# Patient Record
Sex: Male | Born: 1945 | Race: White | Hispanic: No | Marital: Married | Smoking: Former smoker
Health system: Southern US, Community
[De-identification: ages and names within clinical notes are randomized; demographics above are authoritative.]

## PROBLEM LIST (undated history)

## (undated) DIAGNOSIS — N200 Calculus of kidney: Secondary | ICD-10-CM

## (undated) DIAGNOSIS — K279 Peptic ulcer, site unspecified, unspecified as acute or chronic, without hemorrhage or perforation: Secondary | ICD-10-CM

## (undated) DIAGNOSIS — E119 Type 2 diabetes mellitus without complications: Secondary | ICD-10-CM

## (undated) DIAGNOSIS — E7800 Pure hypercholesterolemia, unspecified: Secondary | ICD-10-CM

## (undated) DIAGNOSIS — I10 Essential (primary) hypertension: Secondary | ICD-10-CM

## (undated) HISTORY — DX: Calculus of kidney: N20.0

## (undated) HISTORY — DX: Type 2 diabetes mellitus without complications: E11.9

## (undated) HISTORY — DX: Essential (primary) hypertension: I10

## (undated) HISTORY — DX: Peptic ulcer, site unspecified, unspecified as acute or chronic, without hemorrhage or perforation: K27.9

## (undated) HISTORY — PX: KNEE ARTHROSCOPY: SUR90

## (undated) HISTORY — PX: THYROIDECTOMY: SHX17

## (undated) HISTORY — PX: SPINE SURGERY: SHX786

## (undated) HISTORY — DX: Pure hypercholesterolemia, unspecified: E78.00

## (undated) HISTORY — PX: NECK SURGERY: SHX720

## (undated) HISTORY — PX: BACK SURGERY: SHX140

## (undated) HISTORY — PX: BRAIN SURGERY: SHX531

## (undated) HISTORY — PX: CHOLECYSTECTOMY: SHX55

---

## 1998-02-05 ENCOUNTER — Ambulatory Visit (HOSPITAL_COMMUNITY): Admission: RE | Admit: 1998-02-05 | Discharge: 1998-02-05 | Payer: Self-pay | Admitting: Neurosurgery

## 1998-02-19 ENCOUNTER — Ambulatory Visit (HOSPITAL_COMMUNITY): Admission: RE | Admit: 1998-02-19 | Discharge: 1998-02-19 | Payer: Self-pay | Admitting: Neurosurgery

## 2014-10-22 DIAGNOSIS — Z125 Encounter for screening for malignant neoplasm of prostate: Secondary | ICD-10-CM | POA: Diagnosis not present

## 2014-10-22 DIAGNOSIS — E038 Other specified hypothyroidism: Secondary | ICD-10-CM | POA: Diagnosis not present

## 2014-10-22 DIAGNOSIS — I10 Essential (primary) hypertension: Secondary | ICD-10-CM | POA: Diagnosis not present

## 2014-10-22 DIAGNOSIS — E785 Hyperlipidemia, unspecified: Secondary | ICD-10-CM | POA: Diagnosis not present

## 2014-10-22 DIAGNOSIS — M545 Low back pain: Secondary | ICD-10-CM | POA: Diagnosis not present

## 2014-10-22 DIAGNOSIS — E119 Type 2 diabetes mellitus without complications: Secondary | ICD-10-CM | POA: Diagnosis not present

## 2015-01-15 DIAGNOSIS — M25562 Pain in left knee: Secondary | ICD-10-CM | POA: Diagnosis not present

## 2015-01-16 DIAGNOSIS — M25562 Pain in left knee: Secondary | ICD-10-CM | POA: Diagnosis not present

## 2015-01-16 DIAGNOSIS — M23322 Other meniscus derangements, posterior horn of medial meniscus, left knee: Secondary | ICD-10-CM | POA: Diagnosis not present

## 2015-01-16 DIAGNOSIS — M23222 Derangement of posterior horn of medial meniscus due to old tear or injury, left knee: Secondary | ICD-10-CM | POA: Diagnosis not present

## 2015-01-17 DIAGNOSIS — S83242A Other tear of medial meniscus, current injury, left knee, initial encounter: Secondary | ICD-10-CM | POA: Diagnosis not present

## 2015-01-21 DIAGNOSIS — E785 Hyperlipidemia, unspecified: Secondary | ICD-10-CM | POA: Diagnosis not present

## 2015-01-21 DIAGNOSIS — M545 Low back pain: Secondary | ICD-10-CM | POA: Diagnosis not present

## 2015-01-21 DIAGNOSIS — E038 Other specified hypothyroidism: Secondary | ICD-10-CM | POA: Diagnosis not present

## 2015-01-21 DIAGNOSIS — E119 Type 2 diabetes mellitus without complications: Secondary | ICD-10-CM | POA: Diagnosis not present

## 2015-01-21 DIAGNOSIS — I10 Essential (primary) hypertension: Secondary | ICD-10-CM | POA: Diagnosis not present

## 2015-01-21 DIAGNOSIS — Z01818 Encounter for other preprocedural examination: Secondary | ICD-10-CM | POA: Diagnosis not present

## 2015-02-08 DIAGNOSIS — M25562 Pain in left knee: Secondary | ICD-10-CM | POA: Diagnosis not present

## 2015-02-08 DIAGNOSIS — M19072 Primary osteoarthritis, left ankle and foot: Secondary | ICD-10-CM | POA: Diagnosis not present

## 2015-02-08 DIAGNOSIS — S83242A Other tear of medial meniscus, current injury, left knee, initial encounter: Secondary | ICD-10-CM | POA: Diagnosis not present

## 2015-02-08 DIAGNOSIS — I10 Essential (primary) hypertension: Secondary | ICD-10-CM | POA: Diagnosis not present

## 2015-02-08 DIAGNOSIS — E119 Type 2 diabetes mellitus without complications: Secondary | ICD-10-CM | POA: Diagnosis not present

## 2015-02-08 DIAGNOSIS — M659 Synovitis and tenosynovitis, unspecified: Secondary | ICD-10-CM | POA: Diagnosis not present

## 2015-02-08 DIAGNOSIS — M23204 Derangement of unspecified medial meniscus due to old tear or injury, left knee: Secondary | ICD-10-CM | POA: Diagnosis not present

## 2015-02-08 DIAGNOSIS — Z87891 Personal history of nicotine dependence: Secondary | ICD-10-CM | POA: Diagnosis not present

## 2015-02-08 DIAGNOSIS — E039 Hypothyroidism, unspecified: Secondary | ICD-10-CM | POA: Diagnosis not present

## 2015-02-11 DIAGNOSIS — M25562 Pain in left knee: Secondary | ICD-10-CM | POA: Diagnosis not present

## 2015-02-11 DIAGNOSIS — M6281 Muscle weakness (generalized): Secondary | ICD-10-CM | POA: Diagnosis not present

## 2015-02-11 DIAGNOSIS — R2689 Other abnormalities of gait and mobility: Secondary | ICD-10-CM | POA: Diagnosis not present

## 2015-02-11 DIAGNOSIS — S83242D Other tear of medial meniscus, current injury, left knee, subsequent encounter: Secondary | ICD-10-CM | POA: Diagnosis not present

## 2015-02-13 DIAGNOSIS — S83242D Other tear of medial meniscus, current injury, left knee, subsequent encounter: Secondary | ICD-10-CM | POA: Diagnosis not present

## 2015-02-13 DIAGNOSIS — M6281 Muscle weakness (generalized): Secondary | ICD-10-CM | POA: Diagnosis not present

## 2015-02-13 DIAGNOSIS — R2689 Other abnormalities of gait and mobility: Secondary | ICD-10-CM | POA: Diagnosis not present

## 2015-02-13 DIAGNOSIS — M25562 Pain in left knee: Secondary | ICD-10-CM | POA: Diagnosis not present

## 2015-04-22 DIAGNOSIS — M545 Low back pain: Secondary | ICD-10-CM | POA: Diagnosis not present

## 2015-04-22 DIAGNOSIS — E785 Hyperlipidemia, unspecified: Secondary | ICD-10-CM | POA: Diagnosis not present

## 2015-04-22 DIAGNOSIS — E119 Type 2 diabetes mellitus without complications: Secondary | ICD-10-CM | POA: Diagnosis not present

## 2015-04-22 DIAGNOSIS — I10 Essential (primary) hypertension: Secondary | ICD-10-CM | POA: Diagnosis not present

## 2015-04-22 DIAGNOSIS — Z9181 History of falling: Secondary | ICD-10-CM | POA: Diagnosis not present

## 2015-08-01 DIAGNOSIS — R197 Diarrhea, unspecified: Secondary | ICD-10-CM | POA: Diagnosis not present

## 2015-08-21 DIAGNOSIS — E119 Type 2 diabetes mellitus without complications: Secondary | ICD-10-CM | POA: Diagnosis not present

## 2015-08-21 DIAGNOSIS — S61409A Unspecified open wound of unspecified hand, initial encounter: Secondary | ICD-10-CM | POA: Diagnosis not present

## 2015-08-21 DIAGNOSIS — E785 Hyperlipidemia, unspecified: Secondary | ICD-10-CM | POA: Diagnosis not present

## 2015-08-21 DIAGNOSIS — Z23 Encounter for immunization: Secondary | ICD-10-CM | POA: Diagnosis not present

## 2015-08-21 DIAGNOSIS — M545 Low back pain: Secondary | ICD-10-CM | POA: Diagnosis not present

## 2015-08-21 DIAGNOSIS — E038 Other specified hypothyroidism: Secondary | ICD-10-CM | POA: Diagnosis not present

## 2015-11-21 DIAGNOSIS — E038 Other specified hypothyroidism: Secondary | ICD-10-CM | POA: Diagnosis not present

## 2015-11-21 DIAGNOSIS — E119 Type 2 diabetes mellitus without complications: Secondary | ICD-10-CM | POA: Diagnosis not present

## 2015-11-21 DIAGNOSIS — I10 Essential (primary) hypertension: Secondary | ICD-10-CM | POA: Diagnosis not present

## 2015-11-21 DIAGNOSIS — Z125 Encounter for screening for malignant neoplasm of prostate: Secondary | ICD-10-CM | POA: Diagnosis not present

## 2015-11-21 DIAGNOSIS — K219 Gastro-esophageal reflux disease without esophagitis: Secondary | ICD-10-CM | POA: Diagnosis not present

## 2015-11-21 DIAGNOSIS — M545 Low back pain: Secondary | ICD-10-CM | POA: Diagnosis not present

## 2016-01-13 DIAGNOSIS — N3090 Cystitis, unspecified without hematuria: Secondary | ICD-10-CM | POA: Diagnosis not present

## 2016-01-13 DIAGNOSIS — N401 Enlarged prostate with lower urinary tract symptoms: Secondary | ICD-10-CM | POA: Diagnosis not present

## 2016-01-14 DIAGNOSIS — R6889 Other general symptoms and signs: Secondary | ICD-10-CM | POA: Diagnosis not present

## 2016-01-14 DIAGNOSIS — R509 Fever, unspecified: Secondary | ICD-10-CM | POA: Diagnosis not present

## 2016-01-14 DIAGNOSIS — N390 Urinary tract infection, site not specified: Secondary | ICD-10-CM | POA: Diagnosis not present

## 2016-02-19 DIAGNOSIS — I10 Essential (primary) hypertension: Secondary | ICD-10-CM | POA: Diagnosis not present

## 2016-02-19 DIAGNOSIS — E785 Hyperlipidemia, unspecified: Secondary | ICD-10-CM | POA: Diagnosis not present

## 2016-02-19 DIAGNOSIS — E038 Other specified hypothyroidism: Secondary | ICD-10-CM | POA: Diagnosis not present

## 2016-02-19 DIAGNOSIS — E119 Type 2 diabetes mellitus without complications: Secondary | ICD-10-CM | POA: Diagnosis not present

## 2016-02-19 DIAGNOSIS — M545 Low back pain: Secondary | ICD-10-CM | POA: Diagnosis not present

## 2016-05-21 DIAGNOSIS — E785 Hyperlipidemia, unspecified: Secondary | ICD-10-CM | POA: Diagnosis not present

## 2016-05-21 DIAGNOSIS — M545 Low back pain: Secondary | ICD-10-CM | POA: Diagnosis not present

## 2016-05-21 DIAGNOSIS — Z9181 History of falling: Secondary | ICD-10-CM | POA: Diagnosis not present

## 2016-05-21 DIAGNOSIS — E119 Type 2 diabetes mellitus without complications: Secondary | ICD-10-CM | POA: Diagnosis not present

## 2016-07-27 DIAGNOSIS — I10 Essential (primary) hypertension: Secondary | ICD-10-CM | POA: Diagnosis not present

## 2016-07-27 DIAGNOSIS — Z981 Arthrodesis status: Secondary | ICD-10-CM | POA: Diagnosis not present

## 2016-08-05 DIAGNOSIS — M48061 Spinal stenosis, lumbar region without neurogenic claudication: Secondary | ICD-10-CM | POA: Diagnosis not present

## 2016-08-05 DIAGNOSIS — Z981 Arthrodesis status: Secondary | ICD-10-CM | POA: Diagnosis not present

## 2016-08-05 DIAGNOSIS — M4807 Spinal stenosis, lumbosacral region: Secondary | ICD-10-CM | POA: Diagnosis not present

## 2016-08-12 DIAGNOSIS — M47816 Spondylosis without myelopathy or radiculopathy, lumbar region: Secondary | ICD-10-CM | POA: Diagnosis not present

## 2016-08-12 DIAGNOSIS — M47813 Spondylosis without myelopathy or radiculopathy, cervicothoracic region: Secondary | ICD-10-CM | POA: Diagnosis not present

## 2016-08-18 ENCOUNTER — Other Ambulatory Visit: Payer: Self-pay | Admitting: Neurosurgery

## 2016-08-18 DIAGNOSIS — M47813 Spondylosis without myelopathy or radiculopathy, cervicothoracic region: Secondary | ICD-10-CM

## 2016-08-19 ENCOUNTER — Ambulatory Visit
Admission: RE | Admit: 2016-08-19 | Discharge: 2016-08-19 | Disposition: A | Discharge status: Home / Self Care | Payer: Medicare Other | Source: Ambulatory Visit | Attending: Neurosurgery | Admitting: Neurosurgery

## 2016-08-19 DIAGNOSIS — M47813 Spondylosis without myelopathy or radiculopathy, cervicothoracic region: Secondary | ICD-10-CM

## 2016-08-19 DIAGNOSIS — M5124 Other intervertebral disc displacement, thoracic region: Secondary | ICD-10-CM | POA: Diagnosis not present

## 2016-08-26 DIAGNOSIS — E119 Type 2 diabetes mellitus without complications: Secondary | ICD-10-CM | POA: Diagnosis not present

## 2016-08-26 DIAGNOSIS — Z23 Encounter for immunization: Secondary | ICD-10-CM | POA: Diagnosis not present

## 2016-08-26 DIAGNOSIS — E785 Hyperlipidemia, unspecified: Secondary | ICD-10-CM | POA: Diagnosis not present

## 2016-08-26 DIAGNOSIS — Z1389 Encounter for screening for other disorder: Secondary | ICD-10-CM | POA: Diagnosis not present

## 2016-08-26 DIAGNOSIS — I10 Essential (primary) hypertension: Secondary | ICD-10-CM | POA: Diagnosis not present

## 2016-11-26 DIAGNOSIS — M545 Low back pain: Secondary | ICD-10-CM | POA: Diagnosis not present

## 2016-11-26 DIAGNOSIS — E785 Hyperlipidemia, unspecified: Secondary | ICD-10-CM | POA: Diagnosis not present

## 2016-11-26 DIAGNOSIS — Z1389 Encounter for screening for other disorder: Secondary | ICD-10-CM | POA: Diagnosis not present

## 2016-11-26 DIAGNOSIS — E119 Type 2 diabetes mellitus without complications: Secondary | ICD-10-CM | POA: Diagnosis not present

## 2017-02-26 DIAGNOSIS — E785 Hyperlipidemia, unspecified: Secondary | ICD-10-CM | POA: Diagnosis not present

## 2017-02-26 DIAGNOSIS — M545 Low back pain: Secondary | ICD-10-CM | POA: Diagnosis not present

## 2017-02-26 DIAGNOSIS — E119 Type 2 diabetes mellitus without complications: Secondary | ICD-10-CM | POA: Diagnosis not present

## 2017-02-26 DIAGNOSIS — I10 Essential (primary) hypertension: Secondary | ICD-10-CM | POA: Diagnosis not present

## 2017-02-26 DIAGNOSIS — K219 Gastro-esophageal reflux disease without esophagitis: Secondary | ICD-10-CM | POA: Diagnosis not present

## 2017-06-02 DIAGNOSIS — Z1389 Encounter for screening for other disorder: Secondary | ICD-10-CM | POA: Diagnosis not present

## 2017-06-02 DIAGNOSIS — E063 Autoimmune thyroiditis: Secondary | ICD-10-CM | POA: Diagnosis not present

## 2017-06-02 DIAGNOSIS — E119 Type 2 diabetes mellitus without complications: Secondary | ICD-10-CM | POA: Diagnosis not present

## 2017-06-02 DIAGNOSIS — Z9181 History of falling: Secondary | ICD-10-CM | POA: Diagnosis not present

## 2017-06-02 DIAGNOSIS — M545 Low back pain: Secondary | ICD-10-CM | POA: Diagnosis not present

## 2017-06-02 DIAGNOSIS — E785 Hyperlipidemia, unspecified: Secondary | ICD-10-CM | POA: Diagnosis not present

## 2017-08-04 DIAGNOSIS — Z23 Encounter for immunization: Secondary | ICD-10-CM | POA: Diagnosis not present

## 2017-09-02 DIAGNOSIS — E785 Hyperlipidemia, unspecified: Secondary | ICD-10-CM | POA: Diagnosis not present

## 2017-09-02 DIAGNOSIS — M545 Low back pain: Secondary | ICD-10-CM | POA: Diagnosis not present

## 2017-09-02 DIAGNOSIS — E063 Autoimmune thyroiditis: Secondary | ICD-10-CM | POA: Diagnosis not present

## 2017-09-02 DIAGNOSIS — E119 Type 2 diabetes mellitus without complications: Secondary | ICD-10-CM | POA: Diagnosis not present

## 2017-09-07 DIAGNOSIS — L578 Other skin changes due to chronic exposure to nonionizing radiation: Secondary | ICD-10-CM | POA: Diagnosis not present

## 2017-09-07 DIAGNOSIS — L814 Other melanin hyperpigmentation: Secondary | ICD-10-CM | POA: Diagnosis not present

## 2017-09-07 DIAGNOSIS — L820 Inflamed seborrheic keratosis: Secondary | ICD-10-CM | POA: Diagnosis not present

## 2017-09-07 DIAGNOSIS — C44319 Basal cell carcinoma of skin of other parts of face: Secondary | ICD-10-CM | POA: Diagnosis not present

## 2017-10-16 DIAGNOSIS — C44329 Squamous cell carcinoma of skin of other parts of face: Secondary | ICD-10-CM | POA: Diagnosis not present

## 2017-12-02 DIAGNOSIS — I10 Essential (primary) hypertension: Secondary | ICD-10-CM | POA: Diagnosis not present

## 2017-12-02 DIAGNOSIS — Z125 Encounter for screening for malignant neoplasm of prostate: Secondary | ICD-10-CM | POA: Diagnosis not present

## 2017-12-02 DIAGNOSIS — E785 Hyperlipidemia, unspecified: Secondary | ICD-10-CM | POA: Diagnosis not present

## 2017-12-02 DIAGNOSIS — M545 Low back pain: Secondary | ICD-10-CM | POA: Diagnosis not present

## 2017-12-02 DIAGNOSIS — E119 Type 2 diabetes mellitus without complications: Secondary | ICD-10-CM | POA: Diagnosis not present

## 2017-12-02 DIAGNOSIS — E063 Autoimmune thyroiditis: Secondary | ICD-10-CM | POA: Diagnosis not present

## 2018-02-28 DIAGNOSIS — J301 Allergic rhinitis due to pollen: Secondary | ICD-10-CM | POA: Diagnosis not present

## 2018-02-28 DIAGNOSIS — M545 Low back pain: Secondary | ICD-10-CM | POA: Diagnosis not present

## 2018-02-28 DIAGNOSIS — E785 Hyperlipidemia, unspecified: Secondary | ICD-10-CM | POA: Diagnosis not present

## 2018-02-28 DIAGNOSIS — E063 Autoimmune thyroiditis: Secondary | ICD-10-CM | POA: Diagnosis not present

## 2018-02-28 DIAGNOSIS — I10 Essential (primary) hypertension: Secondary | ICD-10-CM | POA: Diagnosis not present

## 2018-05-25 DIAGNOSIS — M5416 Radiculopathy, lumbar region: Secondary | ICD-10-CM | POA: Diagnosis not present

## 2018-06-01 DIAGNOSIS — E785 Hyperlipidemia, unspecified: Secondary | ICD-10-CM | POA: Diagnosis not present

## 2018-06-01 DIAGNOSIS — E118 Type 2 diabetes mellitus with unspecified complications: Secondary | ICD-10-CM | POA: Diagnosis not present

## 2018-06-01 DIAGNOSIS — I10 Essential (primary) hypertension: Secondary | ICD-10-CM | POA: Diagnosis not present

## 2018-06-01 DIAGNOSIS — M545 Low back pain: Secondary | ICD-10-CM | POA: Diagnosis not present

## 2018-06-01 DIAGNOSIS — E063 Autoimmune thyroiditis: Secondary | ICD-10-CM | POA: Diagnosis not present

## 2018-06-01 DIAGNOSIS — E119 Type 2 diabetes mellitus without complications: Secondary | ICD-10-CM | POA: Diagnosis not present

## 2018-06-06 ENCOUNTER — Encounter: Payer: Self-pay | Admitting: Physical Medicine & Rehabilitation

## 2018-06-14 ENCOUNTER — Encounter: Payer: Medicare Other | Attending: Physical Medicine & Rehabilitation | Admitting: Physical Medicine & Rehabilitation

## 2018-06-14 ENCOUNTER — Encounter: Payer: Self-pay | Admitting: Physical Medicine & Rehabilitation

## 2018-06-14 VITALS — BP 147/96 | HR 81 | Ht 67.0 in | Wt 201.4 lb

## 2018-06-14 DIAGNOSIS — E7800 Pure hypercholesterolemia, unspecified: Secondary | ICD-10-CM | POA: Diagnosis not present

## 2018-06-14 DIAGNOSIS — M5415 Radiculopathy, thoracolumbar region: Secondary | ICD-10-CM | POA: Diagnosis not present

## 2018-06-14 DIAGNOSIS — G8929 Other chronic pain: Secondary | ICD-10-CM | POA: Diagnosis not present

## 2018-06-14 DIAGNOSIS — E119 Type 2 diabetes mellitus without complications: Secondary | ICD-10-CM | POA: Diagnosis not present

## 2018-06-14 DIAGNOSIS — I10 Essential (primary) hypertension: Secondary | ICD-10-CM | POA: Diagnosis not present

## 2018-06-14 DIAGNOSIS — M961 Postlaminectomy syndrome, not elsewhere classified: Secondary | ICD-10-CM | POA: Insufficient documentation

## 2018-06-14 DIAGNOSIS — Z87442 Personal history of urinary calculi: Secondary | ICD-10-CM | POA: Diagnosis not present

## 2018-06-14 DIAGNOSIS — Z87891 Personal history of nicotine dependence: Secondary | ICD-10-CM | POA: Insufficient documentation

## 2018-06-14 DIAGNOSIS — M48061 Spinal stenosis, lumbar region without neurogenic claudication: Secondary | ICD-10-CM | POA: Insufficient documentation

## 2018-06-14 DIAGNOSIS — Z981 Arthrodesis status: Secondary | ICD-10-CM | POA: Insufficient documentation

## 2018-06-14 DIAGNOSIS — M545 Low back pain: Secondary | ICD-10-CM | POA: Insufficient documentation

## 2018-06-14 MED ORDER — METHOCARBAMOL 500 MG PO TABS: 500.0000 mg | ORAL_TABLET | Freq: Three times a day (TID) | ORAL | 2 refills | Status: DC | PRN

## 2018-06-14 NOTE — Progress Notes (Signed)
Subjective:    Patient ID: Sean [name], male    DOB: 22-Jan-1946, 72 y.o.   MRN: [id number]  HPI   This is an initial office visit for Sean [name] who is a 72 year old male with diabetes and hypertension with a 20 year history of low back pain who is status post numerous surgeries beginning int he mid 1980's including an L4-L5 fusion in 2006 as well as a right L2-L3 TLIF in 2009 and T12/S1-iliac wing fusion in 2010. He has worsening pain over this time which has affected his quality of back. He has had increasing mid back pain as well as numbness and tingling in both legs. He finds that lying supine is the most painful position and sleep is very poor as a result. For pain he's taking hydrocodone 4x daily which provides some relief. He hasn't seen a Psychologist, sport and exercise, and his primary has been following his pain.  MRI of the thoracic spine dated August 19, 2016 demonstrates his T12-L1 fusion and moderate thoracic facet arthropathy with mild foraminal stenosis at T9 on the left and mild to moderate  T10.  A CT with contrast of the lumbar spine dated 08/05/2016 notes mild bilateral foraminal stenosis encroaching upon the L5 nerve roots bilaterally, at L4-L5 there is similar findings.  There are also similar findings at L2-L3/L3-L4.  Canal is reported as decompressed posteriorly without central stenosis.  Ice does provide some relief as well as hot baths can help too.  He also has a TENS unit but is not using it regularly.  He remains active but doesn't do any focused stretching of his low back. He likes to use his stationary bike.   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  He notes that when he is more active out on his farm his back pain is worse as a result.  Pain Inventory Average Pain 9 Pain Right Now 9 My pain is constant and  sharp  In the last 24 hours, has pain interfered with the following? General activity 9 Relation with others 9 Enjoyment of life 10 What TIME of day is your pain at its worst? night Sleep (in general) Poor  Pain is worse with: inactivity Pain improves with: heat/ice, medication and TENS Relief from Meds: 2  Mobility walk without assistance ability to climb steps?  yes do you drive?  yes  Function retired  Neuro/Psych bowel control problems tingling spasms  Prior Studies new patient  Physicians involved in your care new patient   No family history on file. Social History   Socioeconomic History  . Marital status: Married    Spouse name: Not on file  . Number of children: Not on file  . Years of education: Not on file  . Highest education level: Not on file  Occupational History  . Not on file  Social Needs  . Financial resource strain: Not on file  . Food insecurity:    Worry: Not on file    Inability: Not on file  . Transportation needs:    Medical: Not on file    Non-medical: Not on file  Tobacco Use  . Smoking status: Former Smoker    Types: Cigarettes  . Smokeless tobacco: Never Used  Substance and Sexual Activity  . Alcohol use: Not Currently  .  Drug use: Never  . Sexual activity: Not on file  Lifestyle  . Physical activity:    Days per week: Not on file    Minutes per session: Not on file  . Stress: Not on file  Relationships  . Social connections:    Talks on phone: Not on file    Gets together: Not on file    Attends religious service: Not on file    Active member of club or organization: Not on file    Attends meetings of clubs or organizations: Not on file    Relationship status: Not on file  Other Topics Concern  . Not on file  Social History Narrative  . Not on file   Past Surgical History:  Procedure Laterality Date  . BACK SURGERY    . BRAIN SURGERY    . CHOLECYSTECTOMY    . KNEE ARTHROSCOPY    . NECK SURGERY    .  THYROIDECTOMY     Past Medical History:  Diagnosis Date  . Diabetes ([place])   . Hypercholesteremia   . Hypertension   . Kidney stone   . Peptic ulcer disease    Ht 5\' 7"  (1.702 m)   Wt 201 lb 6.4 oz (91.4 kg)   BMI 31.54 kg/m   Opioid Risk Score:   Fall Risk Score:  `1  Depression screen PHQ 2/9  No flowsheet data found.   Review of Systems  Constitutional: Negative.   HENT: Negative.   Eyes: Negative.   Respiratory: Negative.   Cardiovascular: Negative.   Gastrointestinal: Positive for diarrhea.  Endocrine: Negative.   Genitourinary: Negative.   Musculoskeletal: Positive for arthralgias, back pain and myalgias.  Skin: Negative.   Allergic/Immunologic: Negative.   Neurological: Positive for numbness.  Hematological: Negative.   Psychiatric/Behavioral: Negative.   All other systems reviewed and are negative.      Objective:   Physical Exam  General: Alert and oriented x 3, No apparent distress HEENT: Head is normocephalic, atraumatic, PERRLA, EOMI, sclera anicteric, oral mucosa pink and moist, dentition intact, ext ear canals clear,  Neck: Supple without JVD or lymphadenopathy Heart: Reg rate and rhythm. No murmurs rubs or gallops Chest: CTA bilaterally without wheezes, rales, or rhonchi; no distress Abdomen: Soft, non-tender, non-distended, bowel sounds positive. Extremities: No clubbing, cyanosis, or edema. Pulses are 2+ Skin: Clean and intact without signs of breakdown Neuro: Pt is cognitively appropriate with normal insight, memory, and awareness. Cranial nerves 2-12 are intact. Sensory exam is normal. Reflexes are 2+ in upper extremities and trace to absent in the lower extremities fine motor coordination is intact. No tremors. Motor function is grossly 5/5 in all 4 limbs.  Musculoskeletal: He has mild levoscoliosis of the thoracolumbar spine.  He is able to bend at the waist to about 60 to 70 degrees but mostly this is at his hips.  There is very minimal  lumbar flexibility.  He is able to lateral bend and rotate to about 20 degrees and extend to about 20 degrees as well.  He is tender with palpation at the T12/T11 area with notable muscle spasm seen.  PSIS areas and greater trochanters are minimally tender.  He ambulates with a slightly wide-based gait and without antalgia. Psych: Pt's affect is appropriate. Pt is cooperative        Assessment & Plan:  1. Chronic low back pain 2. Lumbar post-laminectomy syndrome with at least 6 lumbar surgeries by my count.  He has remained quite active despite his limitations  and pain.  I am afraid that he may be doing too much at times, however.  He has some mechanical pain proximal to his fusion site.  May be a muscular component to this.  Most recent MRI did not reveal any substantial thoracic abnormalities although degeneration at T11/T12 would be a concern.   Plan: 1.  Made referral to [facility] in [place] to address lumbar strengthening and range of motion as well as ergonomics and posture.  He needs to come up with an individualized home exercise program to improve his range of motion and mechanics. 2.  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  He wants to keep up doing the things he has done for years but it appears to be exacerbating his pain at this point.  Hopefully therapy will help him in this area at least from an ergonomic standpoint. 3.  Trial of Robaxin 500 mg every 8 hours as needed for muscle spasms 4.  Continue hydrocodone 5/325 per primary physician every 6 hours as needed 5.  Consider new imaging of his thoracolumbar spine given that the old imaging is 72 years old at this point. 6.  Consider referral to neurosurgery for spinal stimulator assessment.  Both patient and daughter do not want to increase or add further narcotic medications.  45  minutes was spent today with the patient with more than 50% of this time spent in direct patient consultation in chart/imaging review.  I will see him back in about 1 month's time.

## 2018-06-14 NOTE — Patient Instructions (Addendum)
BE AGGRESSIVE WITH YOUR HEAT, TENS AND TOPICAL TREATMENTS.    YOU NEED TO BE REALISTIC ABOUT YOUR ACTIVITIES

## 2018-07-01 DIAGNOSIS — M5415 Radiculopathy, thoracolumbar region: Secondary | ICD-10-CM | POA: Diagnosis not present

## 2018-07-01 DIAGNOSIS — M545 Low back pain: Secondary | ICD-10-CM | POA: Diagnosis not present

## 2018-07-01 DIAGNOSIS — M961 Postlaminectomy syndrome, not elsewhere classified: Secondary | ICD-10-CM | POA: Diagnosis not present

## 2018-07-11 ENCOUNTER — Encounter: Payer: Medicare Other | Admitting: Physical Medicine & Rehabilitation

## 2018-07-15 DIAGNOSIS — M545 Low back pain: Secondary | ICD-10-CM | POA: Diagnosis not present

## 2018-07-15 DIAGNOSIS — M961 Postlaminectomy syndrome, not elsewhere classified: Secondary | ICD-10-CM | POA: Diagnosis not present

## 2018-07-15 DIAGNOSIS — M5415 Radiculopathy, thoracolumbar region: Secondary | ICD-10-CM | POA: Diagnosis not present

## 2018-07-28 DIAGNOSIS — M5415 Radiculopathy, thoracolumbar region: Secondary | ICD-10-CM | POA: Diagnosis not present

## 2018-07-28 DIAGNOSIS — M545 Low back pain: Secondary | ICD-10-CM | POA: Diagnosis not present

## 2018-07-28 DIAGNOSIS — M961 Postlaminectomy syndrome, not elsewhere classified: Secondary | ICD-10-CM | POA: Diagnosis not present

## 2018-08-03 ENCOUNTER — Encounter: Payer: Self-pay | Admitting: Physical Medicine & Rehabilitation

## 2018-08-03 ENCOUNTER — Encounter: Payer: Medicare Other | Attending: Physical Medicine & Rehabilitation | Admitting: Physical Medicine & Rehabilitation

## 2018-08-03 VITALS — BP 124/86 | HR 84 | Ht 68.0 in | Wt 204.0 lb

## 2018-08-03 DIAGNOSIS — M961 Postlaminectomy syndrome, not elsewhere classified: Secondary | ICD-10-CM | POA: Diagnosis not present

## 2018-08-03 DIAGNOSIS — E119 Type 2 diabetes mellitus without complications: Secondary | ICD-10-CM | POA: Diagnosis not present

## 2018-08-03 DIAGNOSIS — M5415 Radiculopathy, thoracolumbar region: Secondary | ICD-10-CM

## 2018-08-03 DIAGNOSIS — E7800 Pure hypercholesterolemia, unspecified: Secondary | ICD-10-CM | POA: Diagnosis not present

## 2018-08-03 DIAGNOSIS — M48061 Spinal stenosis, lumbar region without neurogenic claudication: Secondary | ICD-10-CM | POA: Diagnosis not present

## 2018-08-03 DIAGNOSIS — M545 Low back pain: Secondary | ICD-10-CM | POA: Insufficient documentation

## 2018-08-03 DIAGNOSIS — Z87442 Personal history of urinary calculi: Secondary | ICD-10-CM | POA: Insufficient documentation

## 2018-08-03 DIAGNOSIS — I10 Essential (primary) hypertension: Secondary | ICD-10-CM | POA: Diagnosis not present

## 2018-08-03 DIAGNOSIS — Z87891 Personal history of nicotine dependence: Secondary | ICD-10-CM | POA: Diagnosis not present

## 2018-08-03 DIAGNOSIS — Z981 Arthrodesis status: Secondary | ICD-10-CM | POA: Insufficient documentation

## 2018-08-03 DIAGNOSIS — G8929 Other chronic pain: Secondary | ICD-10-CM | POA: Insufficient documentation

## 2018-08-03 MED ORDER — HYDROCODONE-ACETAMINOPHEN 7.5-325 MG PO TABS: 1.0000 | ORAL_TABLET | Freq: Four times a day (QID) | ORAL | 0 refills | Status: DC | PRN

## 2018-08-03 NOTE — Progress Notes (Signed)
Subjective:    Patient ID: Sean [name], male    DOB: [birth year], 72 y.o.   MRN: [id number]  HPI   Mr. Dethloff is here in follow-up of his chronic low back pain after multiple surgeries.  I sent [facility] to [facility] in [place] and he tells me that therapy "did him no good".  They did some massaging on him but were limited in exercise activities given his multiple fusions.  He eventually just stopped going.  I placed him on some Robaxin to help with muscle spasms and the medication made him sick on his stomach.  He still using hydrocodone per his primary 5 mg/325 every 6 hours as needed.  He uses this mostly in the evening hours and overnight.  I asked him if he made any modifications to his physical activities and he said essentially no.  If he has heavy loads or things that to perform he seems hesitant to ask help from family members or friends.  Pain Inventory Average Pain 8 Pain Right Now 8 My pain is constant, dull and tingling  In the last 24 hours, has pain interfered with the following? General activity 6 Relation with others 6 Enjoyment of life 6 What TIME of day is your pain at its worst? night Sleep (in general) Poor  Pain is worse with: walking, sitting and standing Pain improves with: medication and TENS Relief from Meds: 5  Mobility walk without assistance  Function retired  Neuro/Psych numbness  Prior Studies Any changes since last visit?  no  Physicians involved in your care Any changes since last visit?  no   No family history on file. Social History   Socioeconomic History  . Marital status: Married    Spouse name: Not on file  . Number of children: Not on file  . Years of education: Not on file  . Highest education level: Not on file  Occupational History  . Not on file  Social Needs  . Financial resource strain: Not on file  . Food insecurity:    Worry: Not on file    Inability: Not on file  . Transportation needs:    Medical:  Not on file    Non-medical: Not on file  Tobacco Use  . Smoking status: Former Smoker    Types: Cigarettes  . Smokeless tobacco: Never Used  Substance and Sexual Activity  . Alcohol use: Not Currently  . Drug use: Never  . Sexual activity: Not on file  Lifestyle  . Physical activity:    Days per week: Not on file    Minutes per session: Not on file  . Stress: Not on file  Relationships  . Social connections:    Talks on phone: Not on file    Gets together: Not on file    Attends religious service: Not on file    Active member of club or organization: Not on file    Attends meetings of clubs or organizations: Not on file    Relationship status: Not on file  Other Topics Concern  . Not on file  Social History Narrative  . Not on file   Past Surgical History:  Procedure Laterality Date  . BACK SURGERY    . BRAIN SURGERY    . CHOLECYSTECTOMY    . KNEE ARTHROSCOPY    . NECK SURGERY    . THYROIDECTOMY     Past Medical History:  Diagnosis Date  . Diabetes ([place])   . Hypercholesteremia   .  Hypertension   . Kidney stone   . Peptic ulcer disease    There were no vitals taken for this visit.  Opioid Risk Score:   Fall Risk Score:  `1  Depression screen PHQ 2/9  No flowsheet data found.   Review of Systems  Constitutional: Negative.   HENT: Negative.   Eyes: Negative.   Respiratory: Negative.   Cardiovascular: Negative.   Gastrointestinal: Negative.   Endocrine: Negative.   Genitourinary: Negative.   Musculoskeletal: Positive for arthralgias, back pain and myalgias.  Skin: Negative.   Allergic/Immunologic: Negative.   Neurological: Positive for numbness.  Hematological: Negative.   Psychiatric/Behavioral: Negative.   All other systems reviewed and are negative.      Objective:   Physical Exam General: No acute distress HEENT: EOMI, oral membranes moist Cards: reg rate  Chest: normal effort Abdomen: Soft, NT, ND Skin: dry, intact Extremities: no  edema   Neuro:  Cognitively appropriate.  Motor and sensory exam grossly 4+ to 5 out of 5 and 2 out of 2 respectively. Musculoskeletal:  Ongoing levoscoliosis of the thoracolumbar spine.  He is able to bend to about 70 degrees but mostly this remains hip flexion.  He has minimal lumbar extension and overall minimal lumbar flexibility.  Rotating cause significant pain.  He is tender along palpation of the L4-S1 region as well as higher in the T12-L1 area.  There is notable spasms.  Continue to walk with wide-based gait and antalgia favoring each leg. Psych: Pt's affect is appropriate. Pt is cooperative        Assessment & Plan:  1. Chronic low back pain 2. Lumbar post-laminectomy syndrome with at least 6 lumbar surgeries by my count.  He has remained quite active despite his limitations and pain.  I am afraid that he may be doing too much at times, however.  He has some mechanical pain proximal to his fusion site.  May be a muscular component to this.  Most recent MRI did not reveal any substantial thoracic abnormalities although degeneration at T11/T12 would be a concern.   Plan: 1.    Discussed the importance of reasonable physical activity and the fact that he needs to begin using some common sense at home.  He did seem to express an understanding of this and will work harder to make better choices as far as his physical activity at home is concerned 2.    Discussed treatment options at length today.  Reviewed the concept of a spinal stimulator.  He would like to proceed with assessment for a spinal stimulator.  Made referral to Dr. Maryjean Ka. 3.    Maintain home exercise program to tolerance. 4.  Increase hydrocodone to 7.5/325 1 every 6 hours as needed #120.  Can refill again in about 1 month's time. 5.    Will defer any further neuro imaging of his spine to Dr. Maryjean Ka and his team..   25  minutes was spent today with the patient with more than 50% of this time spent in direct patient  consultation and review and discussion of spinal stimulator as well as his mechanics and ergonomic all considerations..  I will see him back in about 2 month's time.

## 2018-08-03 NOTE — Patient Instructions (Signed)
PLEASE FEEL FREE TO CALL OUR OFFICE WITH ANY PROBLEMS OR QUESTIONS ([phone])      

## 2018-08-16 DIAGNOSIS — Z23 Encounter for immunization: Secondary | ICD-10-CM | POA: Diagnosis not present

## 2018-09-01 DIAGNOSIS — E118 Type 2 diabetes mellitus with unspecified complications: Secondary | ICD-10-CM | POA: Diagnosis not present

## 2018-09-01 DIAGNOSIS — E063 Autoimmune thyroiditis: Secondary | ICD-10-CM | POA: Diagnosis not present

## 2018-09-01 DIAGNOSIS — I10 Essential (primary) hypertension: Secondary | ICD-10-CM | POA: Diagnosis not present

## 2018-09-01 DIAGNOSIS — M545 Low back pain: Secondary | ICD-10-CM | POA: Diagnosis not present

## 2018-09-01 DIAGNOSIS — E785 Hyperlipidemia, unspecified: Secondary | ICD-10-CM | POA: Diagnosis not present

## 2018-09-13 ENCOUNTER — Telehealth: Payer: Self-pay

## 2018-09-13 DIAGNOSIS — M5415 Radiculopathy, thoracolumbar region: Secondary | ICD-10-CM

## 2018-09-13 DIAGNOSIS — M961 Postlaminectomy syndrome, not elsewhere classified: Secondary | ICD-10-CM

## 2018-09-13 MED ORDER — HYDROCODONE-ACETAMINOPHEN 7.5-325 MG PO TABS: 1.0000 | ORAL_TABLET | Freq: Four times a day (QID) | ORAL | 0 refills | Status: DC | PRN

## 2018-09-13 NOTE — Telephone Encounter (Signed)
Med refilled. Keep appt

## 2018-09-13 NOTE — Telephone Encounter (Signed)
Contacted patients daughter, informed.  The question she was wanting answered was....do you want to see them before their appointment with [facility] or after?  If Dr. Naaman Plummer wanted to see them after they would need to bump their appointment back a week

## 2018-09-13 NOTE — Telephone Encounter (Signed)
I thought I answered that: keep appt.....so yes see me as scheduled.

## 2018-09-13 NOTE — Telephone Encounter (Signed)
Patient daughter Jeannene Patella called stating patient needs a refill on Hydrocodone. Last filled 08/16/18 #120. [facility] in [place]. Next appt 10/03/2018. Pam did want to know did patient need to wait to see you after he sees [facility] because his appt with them is 10/05/2018?

## 2018-09-14 NOTE — Telephone Encounter (Signed)
Called patient and relayed information

## 2018-09-16 ENCOUNTER — Other Ambulatory Visit: Payer: Self-pay

## 2018-09-16 NOTE — Patient Outreach (Signed)
[facility]) Care Management  09/16/2018  Mykal L Burdin 09-12-1946 [id number]   Medication Adherence call to Mr. Greogry Goodwyn left a message for patient to call back patient is due on Glimeperide 2 mg and Metformin ER 500 mg. Mr. Smock Is showing past due under [place].   [facility] Management Direct Dial [phone]  Fax [phone] [email or web address]

## 2018-10-03 ENCOUNTER — Encounter: Payer: Self-pay | Admitting: Physical Medicine & Rehabilitation

## 2018-10-03 ENCOUNTER — Encounter: Payer: Medicare Other | Attending: Physical Medicine & Rehabilitation | Admitting: Physical Medicine & Rehabilitation

## 2018-10-03 VITALS — BP 120/79 | HR 85 | Ht 67.0 in | Wt 204.8 lb

## 2018-10-03 DIAGNOSIS — M48061 Spinal stenosis, lumbar region without neurogenic claudication: Secondary | ICD-10-CM | POA: Diagnosis not present

## 2018-10-03 DIAGNOSIS — M961 Postlaminectomy syndrome, not elsewhere classified: Secondary | ICD-10-CM | POA: Insufficient documentation

## 2018-10-03 DIAGNOSIS — E7800 Pure hypercholesterolemia, unspecified: Secondary | ICD-10-CM | POA: Insufficient documentation

## 2018-10-03 DIAGNOSIS — Z79899 Other long term (current) drug therapy: Secondary | ICD-10-CM

## 2018-10-03 DIAGNOSIS — E119 Type 2 diabetes mellitus without complications: Secondary | ICD-10-CM | POA: Diagnosis not present

## 2018-10-03 DIAGNOSIS — M545 Low back pain: Secondary | ICD-10-CM | POA: Insufficient documentation

## 2018-10-03 DIAGNOSIS — G8929 Other chronic pain: Secondary | ICD-10-CM | POA: Diagnosis not present

## 2018-10-03 DIAGNOSIS — Z5181 Encounter for therapeutic drug level monitoring: Secondary | ICD-10-CM

## 2018-10-03 DIAGNOSIS — M5415 Radiculopathy, thoracolumbar region: Secondary | ICD-10-CM | POA: Diagnosis not present

## 2018-10-03 DIAGNOSIS — Z87891 Personal history of nicotine dependence: Secondary | ICD-10-CM | POA: Insufficient documentation

## 2018-10-03 DIAGNOSIS — Z87442 Personal history of urinary calculi: Secondary | ICD-10-CM | POA: Insufficient documentation

## 2018-10-03 DIAGNOSIS — I10 Essential (primary) hypertension: Secondary | ICD-10-CM | POA: Diagnosis not present

## 2018-10-03 DIAGNOSIS — Z981 Arthrodesis status: Secondary | ICD-10-CM | POA: Diagnosis not present

## 2018-10-03 MED ORDER — HYDROCODONE-ACETAMINOPHEN 7.5-325 MG PO TABS: 1.0000 | ORAL_TABLET | Freq: Four times a day (QID) | ORAL | 0 refills | Status: DC | PRN

## 2018-10-03 NOTE — Progress Notes (Signed)
Subjective:    Patient ID: Sean [name], male    DOB: January 01, 1946, 73 y.o.   MRN: [id number]  HPI   Eero is here in follow up of his chronic pain. We increased his hydrocodone to 4x daily as needed which has helped him a good bit.  There are still some gaps with his pain control but they are better as a whole.  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  He has a visit scheduled this week to see Dr. Maryjean Ka regarding the spinal stimulator and he is trying to stay positive in this regard.  He reports some occasional loose stool which he says has been a problem since his gallbladder removal.  He was in to see his primary recently who gave him some Lomotil to try.  Pain Inventory Average Pain 5 Pain Right Now 5 My pain is sharp and aching  In the last 24 hours, has pain interfered with the following? General activity 4 Relation with others 4 Enjoyment of life 6 What TIME of day is your pain at its worst? night Sleep (in general) Poor  Pain is worse with: sitting Pain improves with: medication and TENS Relief from Meds: 5  Mobility walk without assistance ability to climb steps?  yes do you drive?  yes  Function retired  Neuro/Psych bowel control problems numbness spasms  Prior Studies Any changes since last visit?  no  Physicians involved in your care Any changes since last visit?  no   No family history on file. Social History   Socioeconomic History  . Marital status: Married    Spouse name: Not on file  . Number of children: Not on file  . Years of education: Not on file  . Highest education level: Not on file  Occupational History  . Not on file  Social Needs  . Financial resource strain: Not on file  . Food insecurity:    Worry: Not on file    Inability: Not on file  . Transportation needs:    Medical: Not on file    Non-medical: Not on file  Tobacco Use  . Smoking status: Former  Smoker    Types: Cigarettes  . Smokeless tobacco: Never Used  Substance and Sexual Activity  . Alcohol use: Not Currently  . Drug use: Never  . Sexual activity: Not on file  Lifestyle  . Physical activity:    Days per week: Not on file    Minutes per session: Not on file  . Stress: Not on file  Relationships  . Social connections:    Talks on phone: Not on file    Gets together: Not on file    Attends religious service: Not on file    Active member of club or organization: Not on file    Attends meetings of clubs or organizations: Not on file    Relationship status: Not on file  Other Topics Concern  . Not on file  Social History Narrative  . Not on file   Past Surgical History:  Procedure Laterality Date  . BACK SURGERY    . BRAIN SURGERY    . CHOLECYSTECTOMY    . KNEE ARTHROSCOPY    . NECK SURGERY    . THYROIDECTOMY     Past Medical History:  Diagnosis Date  . Diabetes ([place])   . Hypercholesteremia   . Hypertension   . Kidney stone   .  Peptic ulcer disease    There were no vitals taken for this visit.  Opioid Risk Score:   Fall Risk Score:  `1  Depression screen PHQ 2/9  No flowsheet data found.   Review of Systems  Constitutional: Negative.   HENT: Negative.   Eyes: Negative.   Respiratory: Negative.   Cardiovascular: Negative.   Gastrointestinal: Positive for diarrhea.  Endocrine: Negative.   Genitourinary: Negative.   Musculoskeletal: Positive for arthralgias, back pain and myalgias.  Skin: Negative.   Allergic/Immunologic: Negative.   Neurological: Positive for numbness.  Hematological: Negative.   Psychiatric/Behavioral: Negative.        Objective:   Physical Exam General: No acute distress HEENT: EOMI, oral membranes moist Cards: reg rate  Chest: normal effort Abdomen: Soft, NT, ND Skin: dry, intact Extremities: no edema  Neuro: Cognitively appropriate.  Motor and sensory exam grossly 4+ to 5 out of 5 and 2 out of 2  respectively. Musculoskeletal: Continued levoscoliosis of the thoracolumbar spine.  He is able to bend to about 70 degrees but mostly this remains hip flexion.  LB tender to palpation. Bends knees when flexing or extending back to avoid ROM.  Psych:Pt's affect is appropriate. Pt is cooperative     Assessment & Plan:  1. Chronic low back pain 2. Lumbar post-laminectomy syndrome with at least 6lumbar surgeries by my count. He has remained quite active despite his limitations and pain. I am afraid that he may be doing too much at times, however. He has some mechanical pain proximal to his fusion site. May be a muscular component to this. Most recent MRI did not reveal any substantial thoracic abnormalities although degeneration at T11/T12 would be a concern.   Plan: 1.  Reviewed balancing activity with safety/reasonable expectations. He's doing better with this.  2.  Visit to see Dr. Maryjean Ka is this week for spinal stimulator 3.  Maintain home exercise program to tolerance. 4.  Continue hydrocodone at 7.5/325 1 every 6 hours as needed #120.      We will continue the controlled substance monitoring program, this consists of regular clinic visits, examinations, routine drug screening, pill counts as well as use of [place] Controlled Substance Reporting System. [place].    -Medication was refilled and a second prescription was sent to the patient's pharmacy for next month.   5.   Will defer any further neuro imaging of his spine to Dr. Maryjean Ka and his team..   15  minutes was spent today with the patient today. Follow up with me in 2 months.

## 2018-10-03 NOTE — Patient Instructions (Signed)
MAINTAIN YOUR ACTIVITY BUT BE REASONABLE!!

## 2018-10-05 DIAGNOSIS — G894 Chronic pain syndrome: Secondary | ICD-10-CM | POA: Diagnosis not present

## 2018-10-05 DIAGNOSIS — M961 Postlaminectomy syndrome, not elsewhere classified: Secondary | ICD-10-CM | POA: Diagnosis not present

## 2018-10-07 LAB — TOXASSURE SELECT,+ANTIDEPR,UR

## 2018-10-10 ENCOUNTER — Telehealth: Payer: Self-pay | Admitting: *Deleted

## 2018-10-10 NOTE — Telephone Encounter (Signed)
Urine drug screen for this encounter is consistent for prescribed medication 

## 2018-10-31 DIAGNOSIS — G894 Chronic pain syndrome: Secondary | ICD-10-CM | POA: Diagnosis not present

## 2018-11-07 ENCOUNTER — Encounter: Payer: Medicare Other | Attending: Physical Medicine & Rehabilitation | Admitting: Registered Nurse

## 2018-11-07 ENCOUNTER — Encounter: Payer: Self-pay | Admitting: Registered Nurse

## 2018-11-07 VITALS — BP 136/87 | HR 88 | Ht 67.0 in | Wt 206.0 lb

## 2018-11-07 DIAGNOSIS — Z87442 Personal history of urinary calculi: Secondary | ICD-10-CM | POA: Diagnosis not present

## 2018-11-07 DIAGNOSIS — Z981 Arthrodesis status: Secondary | ICD-10-CM | POA: Diagnosis not present

## 2018-11-07 DIAGNOSIS — Z79899 Other long term (current) drug therapy: Secondary | ICD-10-CM | POA: Diagnosis not present

## 2018-11-07 DIAGNOSIS — I10 Essential (primary) hypertension: Secondary | ICD-10-CM | POA: Insufficient documentation

## 2018-11-07 DIAGNOSIS — Z5181 Encounter for therapeutic drug level monitoring: Secondary | ICD-10-CM

## 2018-11-07 DIAGNOSIS — M5416 Radiculopathy, lumbar region: Secondary | ICD-10-CM

## 2018-11-07 DIAGNOSIS — M961 Postlaminectomy syndrome, not elsewhere classified: Secondary | ICD-10-CM | POA: Insufficient documentation

## 2018-11-07 DIAGNOSIS — E119 Type 2 diabetes mellitus without complications: Secondary | ICD-10-CM | POA: Insufficient documentation

## 2018-11-07 DIAGNOSIS — Z87891 Personal history of nicotine dependence: Secondary | ICD-10-CM | POA: Diagnosis not present

## 2018-11-07 DIAGNOSIS — M48061 Spinal stenosis, lumbar region without neurogenic claudication: Secondary | ICD-10-CM | POA: Insufficient documentation

## 2018-11-07 DIAGNOSIS — G8929 Other chronic pain: Secondary | ICD-10-CM | POA: Diagnosis not present

## 2018-11-07 DIAGNOSIS — M545 Low back pain: Secondary | ICD-10-CM | POA: Diagnosis not present

## 2018-11-07 DIAGNOSIS — E7800 Pure hypercholesterolemia, unspecified: Secondary | ICD-10-CM | POA: Insufficient documentation

## 2018-11-07 DIAGNOSIS — G894 Chronic pain syndrome: Secondary | ICD-10-CM

## 2018-11-07 MED ORDER — NORTRIPTYLINE HCL 10 MG PO CAPS: 10.0000 mg | ORAL_CAPSULE | Freq: Every day | ORAL | 2 refills | Status: DC

## 2018-11-07 MED ORDER — HYDROCODONE-ACETAMINOPHEN 7.5-325 MG PO TABS: 1.0000 | ORAL_TABLET | Freq: Four times a day (QID) | ORAL | 0 refills | Status: DC | PRN

## 2018-11-07 NOTE — Progress Notes (Signed)
Subjective:    Patient ID: Sean [name], male    DOB: 27-Jun-1946, 73 y.o.   MRN: [id number]  HPI: Sean [name] is a 73 y.o. male who returns for follow up appointment for chronic pain and medication refill. He states his pain is located in his lower back radiating into left lower extremity. He rates his  Pain 6. His current exercise regime is walking and performing stretching exercises.  Sean [name] daughter in room all questions answered, his daughter is a Marine scientist and dispenses his medication.   Sean [name] Morphine equivalent is 30.00  MME.  Last UDS was performed on 10/10/2018, it was consistent.   Pain Inventory Average Pain 6 Pain Right Now 6 My pain is sharp  In the last 24 hours, has pain interfered with the following? General activity 6 Relation with others 6 Enjoyment of life 6 What TIME of day is your pain at its worst? na Sleep (in general) Fair  Pain is worse with: sitting Pain improves with: heat/ice, medication and TENS Relief from Meds: 5  Mobility walk without assistance ability to climb steps?  yes do you drive?  yes  Function disabled: date disabled .  Neuro/Psych bowel control problems  Prior Studies Any changes since last visit?  no  Physicians involved in your care Any changes since last visit?  no   No family history on file. Social History   Socioeconomic History  . Marital status: Married    Spouse name: Not on file  . Number of children: Not on file  . Years of education: Not on file  . Highest education level: Not on file  Occupational History  . Not on file  Social Needs  . Financial resource strain: Not on file  . Food insecurity:    Worry: Not on file    Inability: Not on file  . Transportation needs:    Medical: Not on file    Non-medical: Not on file  Tobacco Use  . Smoking status: Former Smoker    Types: Cigarettes  . Smokeless tobacco: Never Used  Substance and Sexual Activity  . Alcohol use: Not Currently  .  Drug use: Never  . Sexual activity: Not on file  Lifestyle  . Physical activity:    Days per week: Not on file    Minutes per session: Not on file  . Stress: Not on file  Relationships  . Social connections:    Talks on phone: Not on file    Gets together: Not on file    Attends religious service: Not on file    Active member of club or organization: Not on file    Attends meetings of clubs or organizations: Not on file    Relationship status: Not on file  Other Topics Concern  . Not on file  Social History Narrative  . Not on file   Past Surgical History:  Procedure Laterality Date  . BACK SURGERY    . BRAIN SURGERY    . CHOLECYSTECTOMY    . KNEE ARTHROSCOPY    . NECK SURGERY    . THYROIDECTOMY     Past Medical History:  Diagnosis Date  . Diabetes ([place])   . Hypercholesteremia   . Hypertension   . Kidney stone   . Peptic ulcer disease    BP 136/87   Pulse 88   Ht 5\' 7"  (1.702 m)   Wt 206 lb (93.4 kg)   SpO2 96%   BMI 32.26 kg/m  Opioid Risk Score:   Fall Risk Score:  `1  Depression screen PHQ 2/9  No flowsheet data found.   Review of Systems  Constitutional: Negative.   HENT: Negative.   Eyes: Negative.   Respiratory: Negative.   Cardiovascular: Negative.   Gastrointestinal: Negative.   Endocrine: Negative.   Genitourinary: Negative.   Musculoskeletal: Positive for arthralgias and myalgias.  Skin: Negative.   Allergic/Immunologic: Negative.   Neurological: Positive for numbness.  Hematological: Negative.   Psychiatric/Behavioral: Negative.   All other systems reviewed and are negative.      Objective:   Physical Exam Vitals signs and nursing note reviewed.  Constitutional:      Appearance: Normal appearance.  Neck:     Musculoskeletal: Normal range of motion and neck supple.  Cardiovascular:     Rate and Rhythm: Normal rate and regular rhythm.     Pulses: Normal pulses.     Heart sounds: Normal heart sounds.  Pulmonary:     Effort:  Pulmonary effort is normal.     Breath sounds: Normal breath sounds.  Musculoskeletal:     Comments: Normal Muscle Bulk and Muscle Testing Reveals:  Upper Extremities: Full ROM and Muscle Strength 5/5  Lumbar Paraspinal Tenderness: L-4-L-5 Lower Extremities: Full ROM and Muscle Strength 5/5 Left Lower Extremity Flexion Produces Pain into Left Patella Arises from Table with ease Narrow Based Gait   Skin:    General: Skin is warm and dry.  Neurological:     Mental Status: He is alert and oriented to person, place, and time.  Psychiatric:        Mood and Affect: Mood normal.        Behavior: Behavior normal.           Assessment & Plan:  1. Lumbar Post Laminectomy Syndrome: Continue HEP as Tolerated. Continue to Monitor.  2. Left Lumbar Radiculitis: Continue Nortriptyline. Continue to Monitor.  3. Chronic Pain Syndrome: Awaiting appointment with Dr. Maryjean Ka for spinal stimulator.  Refilled: Hydrocodone 7.5/325 mg one tablet every 6 hours as needed for moderate pain #120. Second script sent for the following month.  We will continue the opioid monitoring program, this consists of regular clinic visits, examinations, urine drug screen, pill counts as well as use of [place] Controlled Substance Reporting system.  20 minutes of face to face patient care time was spent during this visit. All questions were encouraged and answered.  F/U in 2 months.

## 2018-11-07 NOTE — Patient Instructions (Signed)
Send a My Chart Message in a week to evaluate medication   If you prefer to call: [phone]

## 2018-11-10 MED ORDER — HYDROCODONE-ACETAMINOPHEN 7.5-325 MG PO TABS: 1.0000 | ORAL_TABLET | Freq: Four times a day (QID) | ORAL | 0 refills | Status: DC | PRN

## 2018-11-24 DIAGNOSIS — M961 Postlaminectomy syndrome, not elsewhere classified: Secondary | ICD-10-CM | POA: Diagnosis not present

## 2018-12-01 DIAGNOSIS — M47816 Spondylosis without myelopathy or radiculopathy, lumbar region: Secondary | ICD-10-CM | POA: Diagnosis not present

## 2018-12-01 DIAGNOSIS — I10 Essential (primary) hypertension: Secondary | ICD-10-CM | POA: Diagnosis not present

## 2018-12-01 DIAGNOSIS — G894 Chronic pain syndrome: Secondary | ICD-10-CM | POA: Diagnosis not present

## 2018-12-01 DIAGNOSIS — M961 Postlaminectomy syndrome, not elsewhere classified: Secondary | ICD-10-CM | POA: Diagnosis not present

## 2018-12-06 DIAGNOSIS — E118 Type 2 diabetes mellitus with unspecified complications: Secondary | ICD-10-CM | POA: Diagnosis not present

## 2018-12-06 DIAGNOSIS — E785 Hyperlipidemia, unspecified: Secondary | ICD-10-CM | POA: Diagnosis not present

## 2018-12-06 DIAGNOSIS — R197 Diarrhea, unspecified: Secondary | ICD-10-CM | POA: Diagnosis not present

## 2018-12-06 DIAGNOSIS — E063 Autoimmune thyroiditis: Secondary | ICD-10-CM | POA: Diagnosis not present

## 2018-12-08 ENCOUNTER — Other Ambulatory Visit: Payer: Self-pay | Admitting: Neurosurgery

## 2018-12-08 DIAGNOSIS — G894 Chronic pain syndrome: Secondary | ICD-10-CM

## 2018-12-20 ENCOUNTER — Other Ambulatory Visit: Payer: Medicare Other

## 2019-01-02 ENCOUNTER — Encounter: Payer: Medicare Other | Attending: Physical Medicine & Rehabilitation | Admitting: Registered Nurse

## 2019-01-02 ENCOUNTER — Encounter: Payer: Self-pay | Admitting: Registered Nurse

## 2019-01-02 ENCOUNTER — Ambulatory Visit: Payer: Medicare Other | Admitting: Registered Nurse

## 2019-01-02 ENCOUNTER — Other Ambulatory Visit: Payer: Self-pay

## 2019-01-02 VITALS — BP 139/82 | HR 72 | Temp 97.7°F | Resp 20

## 2019-01-02 DIAGNOSIS — Z5181 Encounter for therapeutic drug level monitoring: Secondary | ICD-10-CM

## 2019-01-02 DIAGNOSIS — Z981 Arthrodesis status: Secondary | ICD-10-CM | POA: Insufficient documentation

## 2019-01-02 DIAGNOSIS — M545 Low back pain: Secondary | ICD-10-CM | POA: Insufficient documentation

## 2019-01-02 DIAGNOSIS — Z87442 Personal history of urinary calculi: Secondary | ICD-10-CM | POA: Insufficient documentation

## 2019-01-02 DIAGNOSIS — M961 Postlaminectomy syndrome, not elsewhere classified: Secondary | ICD-10-CM | POA: Insufficient documentation

## 2019-01-02 DIAGNOSIS — Z87891 Personal history of nicotine dependence: Secondary | ICD-10-CM | POA: Insufficient documentation

## 2019-01-02 DIAGNOSIS — G894 Chronic pain syndrome: Secondary | ICD-10-CM | POA: Diagnosis not present

## 2019-01-02 DIAGNOSIS — E119 Type 2 diabetes mellitus without complications: Secondary | ICD-10-CM | POA: Insufficient documentation

## 2019-01-02 DIAGNOSIS — M5416 Radiculopathy, lumbar region: Secondary | ICD-10-CM | POA: Diagnosis not present

## 2019-01-02 DIAGNOSIS — G8929 Other chronic pain: Secondary | ICD-10-CM | POA: Insufficient documentation

## 2019-01-02 DIAGNOSIS — M48061 Spinal stenosis, lumbar region without neurogenic claudication: Secondary | ICD-10-CM | POA: Insufficient documentation

## 2019-01-02 DIAGNOSIS — I10 Essential (primary) hypertension: Secondary | ICD-10-CM | POA: Insufficient documentation

## 2019-01-02 DIAGNOSIS — E7800 Pure hypercholesterolemia, unspecified: Secondary | ICD-10-CM | POA: Insufficient documentation

## 2019-01-02 DIAGNOSIS — Z79899 Other long term (current) drug therapy: Secondary | ICD-10-CM | POA: Diagnosis not present

## 2019-01-02 MED ORDER — HYDROCODONE-ACETAMINOPHEN 7.5-325 MG PO TABS: 1.0000 | ORAL_TABLET | Freq: Four times a day (QID) | ORAL | 0 refills | Status: DC | PRN

## 2019-01-02 NOTE — Progress Notes (Signed)
Subjective:    Patient ID: Sean [name], male    DOB: [birth year], 73 y.o.   MRN: [id number]  HPI: Sean [name] is a 73 y.o. male whose appointment was changed, due to national recommendations of social distancing due to [place] 19, an audio/video telehealth visit is felt to be most appropriate for this patient at this time.  See Chart message from today for the patient's consent to telehealth from [facility].     He states his pain is located in his mid- lower back radiating into his left lower extremity and left foot. He rates his pain 5. His current exercise regime is walking.   Mr. Griffith Morphine equivalent is  30.00MME.  Last UDS was Performed on 10/03/2018, it was consistent.   Wenda Overland RN asked the Health and History Questions. This provider and Wenda Overland verified we were speaking with the correct person using two identifiers.   Pain Inventory Average Pain 5 Pain Right Now 5 My pain is constant, dull and throbbing  In the last 24 hours, has pain interfered with the following? General activity 7 Relation with others 0 Enjoyment of life 5 What TIME of day is your pain at its worst? night Sleep (in general) Poor  Pain is worse with: sitting Pain improves with: heat/ice, therapy/exercise and medication Relief from Meds: 2  Mobility walk without assistance ability to climb steps?  yes do you drive?  yes  Function retired  Neuro/Psych bowel control problems numbness tingling  Prior Studies Any changes since last visit?  no  Physicians involved in your care Any changes since last visit?  no   No family history on file. Social History   Socioeconomic History  . Marital status: Married    Spouse name: Not on file  . Number of children: Not on file  . Years of education: Not on file  . Highest education level: Not on file  Occupational History  . Not on file  Social Needs  . Financial resource strain: Not  on file  . Food insecurity:    Worry: Not on file    Inability: Not on file  . Transportation needs:    Medical: Not on file    Non-medical: Not on file  Tobacco Use  . Smoking status: Former Smoker    Types: Cigarettes  . Smokeless tobacco: Never Used  Substance and Sexual Activity  . Alcohol use: Not Currently  . Drug use: Never  . Sexual activity: Not on file  Lifestyle  . Physical activity:    Days per week: Not on file    Minutes per session: Not on file  . Stress: Not on file  Relationships  . Social connections:    Talks on phone: Not on file    Gets together: Not on file    Attends religious service: Not on file    Active member of club or organization: Not on file    Attends meetings of clubs or organizations: Not on file    Relationship status: Not on file  Other Topics Concern  . Not on file  Social History Narrative  . Not on file   Past Surgical History:  Procedure Laterality Date  . BACK SURGERY    . BRAIN SURGERY    . CHOLECYSTECTOMY    . KNEE ARTHROSCOPY    . NECK SURGERY    . THYROIDECTOMY     Past Medical History:  Diagnosis Date  . Diabetes (  [place])   . Hypercholesteremia   . Hypertension   . Kidney stone   . Peptic ulcer disease    BP 139/82 Comment: self check- reported  Pulse 72 Comment: self check- reported  Temp 97.7 F (36.5 C) Comment: self check- reported  Resp 20 Comment: self check- reported  Opioid Risk Score:   Fall Risk Score:  `1  Depression screen PHQ 2/9  Depression screen PHQ 2/9 01/02/2019  Decreased Interest 0  Down, Depressed, Hopeless 0  PHQ - 2 Score 0   Review of Systems  Constitutional: Negative.   HENT: Negative.   Eyes: Negative.   Respiratory: Negative.   Cardiovascular: Negative.   Gastrointestinal: Positive for diarrhea.  Allergic/Immunologic: Negative.   Neurological: Positive for numbness.       Tingling  Hematological: Negative.   Psychiatric/Behavioral: Negative.   All other systems reviewed  and are negative.      Objective:   Physical Exam Vitals signs and nursing note reviewed.  Neurological:     Mental Status: He is oriented to person, place, and time.           Assessment & Plan:  1. Lumbar Post Laminectomy Syndrome: Continue HEP as Tolerated. Continue to Monitor. 01/02/2019 2. Left Lumbar Radiculitis: Continue Nortriptyline. Continue to Monitor.  3. Chronic Pain Syndrome: Awaiting appointment with Dr. Maryjean Ka for spinal stimulator.  Refilled: Hydrocodone 7.5/325 mg one tablet every 6 hours as needed for moderate pain #120. Second script sent for the following month.  We will continue the opioid monitoring program, this consists of regular clinic visits, examinations, urine drug screen, pill counts as well as use of [place] Controlled Substance Reporting system.  F/U in 2 months.  VIRTUAL VISIT - Location of patient: In his home Location of provider: Office Time spent on call: 10 Minutes

## 2019-01-24 ENCOUNTER — Other Ambulatory Visit: Payer: Medicare Other

## 2019-02-09 ENCOUNTER — Ambulatory Visit
Admission: RE | Admit: 2019-02-09 | Discharge: 2019-02-09 | Disposition: A | Discharge status: Home / Self Care | Payer: Medicare Other | Source: Ambulatory Visit | Attending: Neurosurgery | Admitting: Neurosurgery

## 2019-02-09 ENCOUNTER — Other Ambulatory Visit: Payer: Self-pay

## 2019-02-09 DIAGNOSIS — G894 Chronic pain syndrome: Secondary | ICD-10-CM

## 2019-02-09 DIAGNOSIS — M961 Postlaminectomy syndrome, not elsewhere classified: Secondary | ICD-10-CM | POA: Diagnosis not present

## 2019-02-09 DIAGNOSIS — I10 Essential (primary) hypertension: Secondary | ICD-10-CM | POA: Diagnosis not present

## 2019-02-09 DIAGNOSIS — M4804 Spinal stenosis, thoracic region: Secondary | ICD-10-CM | POA: Diagnosis not present

## 2019-02-15 DIAGNOSIS — G894 Chronic pain syndrome: Secondary | ICD-10-CM | POA: Diagnosis not present

## 2019-02-15 DIAGNOSIS — M961 Postlaminectomy syndrome, not elsewhere classified: Secondary | ICD-10-CM | POA: Diagnosis not present

## 2019-02-28 ENCOUNTER — Encounter: Payer: Self-pay | Admitting: Registered Nurse

## 2019-02-28 ENCOUNTER — Other Ambulatory Visit: Payer: Self-pay

## 2019-02-28 ENCOUNTER — Encounter: Payer: Medicare Other | Attending: Physical Medicine & Rehabilitation | Admitting: Registered Nurse

## 2019-02-28 VITALS — BP 119/79 | HR 95 | Temp 98.3°F | Ht 67.0 in | Wt 199.4 lb

## 2019-02-28 DIAGNOSIS — Z5181 Encounter for therapeutic drug level monitoring: Secondary | ICD-10-CM

## 2019-02-28 DIAGNOSIS — E119 Type 2 diabetes mellitus without complications: Secondary | ICD-10-CM | POA: Diagnosis not present

## 2019-02-28 DIAGNOSIS — I10 Essential (primary) hypertension: Secondary | ICD-10-CM | POA: Diagnosis not present

## 2019-02-28 DIAGNOSIS — Z981 Arthrodesis status: Secondary | ICD-10-CM | POA: Insufficient documentation

## 2019-02-28 DIAGNOSIS — Z79899 Other long term (current) drug therapy: Secondary | ICD-10-CM | POA: Diagnosis not present

## 2019-02-28 DIAGNOSIS — E7800 Pure hypercholesterolemia, unspecified: Secondary | ICD-10-CM | POA: Diagnosis not present

## 2019-02-28 DIAGNOSIS — M961 Postlaminectomy syndrome, not elsewhere classified: Secondary | ICD-10-CM | POA: Insufficient documentation

## 2019-02-28 DIAGNOSIS — Z87442 Personal history of urinary calculi: Secondary | ICD-10-CM | POA: Insufficient documentation

## 2019-02-28 DIAGNOSIS — M5416 Radiculopathy, lumbar region: Secondary | ICD-10-CM

## 2019-02-28 DIAGNOSIS — G8929 Other chronic pain: Secondary | ICD-10-CM | POA: Insufficient documentation

## 2019-02-28 DIAGNOSIS — G894 Chronic pain syndrome: Secondary | ICD-10-CM

## 2019-02-28 DIAGNOSIS — M545 Low back pain: Secondary | ICD-10-CM | POA: Insufficient documentation

## 2019-02-28 DIAGNOSIS — M48061 Spinal stenosis, lumbar region without neurogenic claudication: Secondary | ICD-10-CM | POA: Insufficient documentation

## 2019-02-28 DIAGNOSIS — Z87891 Personal history of nicotine dependence: Secondary | ICD-10-CM | POA: Insufficient documentation

## 2019-02-28 MED ORDER — HYDROCODONE-ACETAMINOPHEN 7.5-325 MG PO TABS: 1.0000 | ORAL_TABLET | Freq: Four times a day (QID) | ORAL | 0 refills | Status: DC | PRN

## 2019-02-28 NOTE — Progress Notes (Signed)
Subjective:    Patient ID: Sean [name], male    DOB: 03-19-1946, 73 y.o.   MRN: [id number]  HPI: Sean [name] is a 73 y.o. male who returns for follow up appointment for chronic pain and medication refill. He states his pain is located in his lower back mainly left side radiating into his left lower extremity. He rates his pain 6. His current exercise regime is walking and performing stretching exercises.  Mr. Resnik states he had his spinal cord stimulator placed two weeks ago by Dr. Ronnald Ramp. He has a F/U appointment with Dr. Ronnald Ramp today. He has noticed decreased in his pain during the daytime, we will began slow titration of his Hydrocodone. He agrees and verbalizes understanding. Also this provider spoke with Mr. Raia daughter regarding the above, she verbalizes understanding.   Mr. Eller Morphine equivalent is 30.00 MME. Last UDS was performed on 10/03/2018, it was consistent.    Pain Inventory Average Pain 6 Pain Right Now 6 My pain is burning  In the last 24 hours, has pain interfered with the following? General activity 4 Relation with others 5 Enjoyment of life 5 What TIME of day is your pain at its worst? night Sleep (in general) Poor  Pain is worse with: standing and unsure Pain improves with: medication Relief from Meds: 8  Mobility walk without assistance ability to climb steps?  yes do you drive?  yes  Function retired  Neuro/Psych bladder control problems  Prior Studies Any changes since last visit?  no  Physicians involved in your care Any changes since last visit?  yes Neurosurgeon Dr. Ronnald Ramp   No family history on file. Social History   Socioeconomic History  . Marital status: Married    Spouse name: Not on file  . Number of children: Not on file  . Years of education: Not on file  . Highest education level: Not on file  Occupational History  . Not on file  Social Needs  . Financial resource strain: Not on file  . Food insecurity:     Worry: Not on file    Inability: Not on file  . Transportation needs:    Medical: Not on file    Non-medical: Not on file  Tobacco Use  . Smoking status: Former Smoker    Types: Cigarettes  . Smokeless tobacco: Never Used  Substance and Sexual Activity  . Alcohol use: Not Currently  . Drug use: Never  . Sexual activity: Not on file  Lifestyle  . Physical activity:    Days per week: Not on file    Minutes per session: Not on file  . Stress: Not on file  Relationships  . Social connections:    Talks on phone: Not on file    Gets together: Not on file    Attends religious service: Not on file    Active member of club or organization: Not on file    Attends meetings of clubs or organizations: Not on file    Relationship status: Not on file  Other Topics Concern  . Not on file  Social History Narrative  . Not on file   Past Surgical History:  Procedure Laterality Date  . BACK SURGERY    . BRAIN SURGERY    . CHOLECYSTECTOMY    . KNEE ARTHROSCOPY    . NECK SURGERY    . THYROIDECTOMY     Past Medical History:  Diagnosis Date  . Diabetes ([place])   . Hypercholesteremia   .  Hypertension   . Kidney stone   . Peptic ulcer disease    BP 119/79   Pulse 95   Temp 98.3 F (36.8 C)   Ht 5\' 7"  (1.702 m)   Wt 199 lb 6.4 oz (90.4 kg)   SpO2 93%   BMI 31.23 kg/m   Opioid Risk Score:   Fall Risk Score:  `1  Depression screen PHQ 2/9  Depression screen [facility] 2/9 02/28/2019 01/02/2019  Decreased Interest 0 0  Down, Depressed, Hopeless 0 0  PHQ - 2 Score 0 0    Review of Systems  Constitutional: Negative.   HENT: Negative.   Eyes: Negative.   Respiratory: Negative.   Cardiovascular: Negative.   Gastrointestinal: Negative.   Endocrine: Negative.   Genitourinary: Negative.   Musculoskeletal: Negative.   Skin: Negative.   Allergic/Immunologic: Negative.   Neurological: Negative.   Hematological: Negative.   Psychiatric/Behavioral: Negative.   All other systems  reviewed and are negative.      Objective:   Physical Exam Vitals signs and nursing note reviewed.  Constitutional:      Appearance: Normal appearance.  Neck:     Musculoskeletal: Normal range of motion and neck supple.  Cardiovascular:     Rate and Rhythm: Normal rate and regular rhythm.     Pulses: Normal pulses.     Heart sounds: Normal heart sounds.  Pulmonary:     Effort: Pulmonary effort is normal.     Breath sounds: Normal breath sounds.  Musculoskeletal:     Comments: Normal Muscle Bulk and Muscle Testing Reveals:  Upper Extremities: Full ROM and Muscle Strength 5/5  Back without spinal tenderness noted  Lower Extremities: Full ROM and Muscle Strength 5/5 Arises from chair with ease Narrow Based Gait   Skin:    General: Skin is warm and dry.  Neurological:     Mental Status: He is alert and oriented to person, place, and time.  Psychiatric:        Mood and Affect: Mood normal.        Behavior: Behavior normal.           Assessment & Plan:  1. Lumbar Post Laminectomy Syndrome: Continue HEP as Tolerated. Continue to Monitor. 02/28/2019 2. Left Lumbar Radiculitis: Continue Nortriptyline. Continue to Monitor.  3. Chronic Pain Syndrome: S/P spinal stimulator 2 weeks ago by Dr. Ronnald Ramp. .  Refilled: Began Slow Weaning: Hydrocodone 7.5/325 mg one tablet every 6 hours as needed for moderate pain #115. Call office in 4 weeks to evaluate medication change, he verbalizes understanding. We will continue the opioid monitoring program, this consists of regular clinic visits, examinations, urine drug screen, pill counts as well as use of [place] Controlled Substance Reporting system.  F/U in 2 months.

## 2019-03-01 ENCOUNTER — Encounter: Payer: Self-pay | Admitting: Registered Nurse

## 2019-03-06 ENCOUNTER — Ambulatory Visit: Payer: Medicare Other | Admitting: Registered Nurse

## 2019-03-08 DIAGNOSIS — J301 Allergic rhinitis due to pollen: Secondary | ICD-10-CM | POA: Diagnosis not present

## 2019-03-08 DIAGNOSIS — E118 Type 2 diabetes mellitus with unspecified complications: Secondary | ICD-10-CM | POA: Diagnosis not present

## 2019-03-08 DIAGNOSIS — I10 Essential (primary) hypertension: Secondary | ICD-10-CM | POA: Diagnosis not present

## 2019-03-08 DIAGNOSIS — E785 Hyperlipidemia, unspecified: Secondary | ICD-10-CM | POA: Diagnosis not present

## 2019-03-08 DIAGNOSIS — E063 Autoimmune thyroiditis: Secondary | ICD-10-CM | POA: Diagnosis not present

## 2019-04-05 ENCOUNTER — Telehealth: Payer: Self-pay | Admitting: Registered Nurse

## 2019-04-05 DIAGNOSIS — M961 Postlaminectomy syndrome, not elsewhere classified: Secondary | ICD-10-CM

## 2019-04-05 MED ORDER — HYDROCODONE-ACETAMINOPHEN 7.5-325 MG PO TABS: 1.0000 | ORAL_TABLET | Freq: Four times a day (QID) | ORAL | 0 refills | Status: DC | PRN

## 2019-04-05 NOTE — Telephone Encounter (Signed)
Message in regards to patients medications.

## 2019-04-05 NOTE — Telephone Encounter (Signed)
PMP was reviewed, last Hydrocodone was filled on 03/13/2019. Hydrocodone e-scribed. My-Chart message sent to Sean [name] regarding the above. Next schedule visit is on 05/08/2019.

## 2019-05-08 ENCOUNTER — Encounter: Payer: Medicare Other | Attending: Physical Medicine & Rehabilitation | Admitting: Registered Nurse

## 2019-05-08 ENCOUNTER — Encounter: Payer: Self-pay | Admitting: Registered Nurse

## 2019-05-08 ENCOUNTER — Other Ambulatory Visit: Payer: Self-pay

## 2019-05-08 VITALS — BP 111/76 | HR 96 | Temp 97.7°F | Resp 16 | Ht 67.0 in | Wt 200.2 lb

## 2019-05-08 DIAGNOSIS — G894 Chronic pain syndrome: Secondary | ICD-10-CM

## 2019-05-08 DIAGNOSIS — Z87891 Personal history of nicotine dependence: Secondary | ICD-10-CM | POA: Diagnosis not present

## 2019-05-08 DIAGNOSIS — Z5181 Encounter for therapeutic drug level monitoring: Secondary | ICD-10-CM

## 2019-05-08 DIAGNOSIS — I10 Essential (primary) hypertension: Secondary | ICD-10-CM | POA: Insufficient documentation

## 2019-05-08 DIAGNOSIS — Z79899 Other long term (current) drug therapy: Secondary | ICD-10-CM | POA: Diagnosis not present

## 2019-05-08 DIAGNOSIS — M545 Low back pain: Secondary | ICD-10-CM | POA: Diagnosis not present

## 2019-05-08 DIAGNOSIS — Z981 Arthrodesis status: Secondary | ICD-10-CM | POA: Insufficient documentation

## 2019-05-08 DIAGNOSIS — E7800 Pure hypercholesterolemia, unspecified: Secondary | ICD-10-CM | POA: Diagnosis not present

## 2019-05-08 DIAGNOSIS — M961 Postlaminectomy syndrome, not elsewhere classified: Secondary | ICD-10-CM | POA: Diagnosis not present

## 2019-05-08 DIAGNOSIS — M48061 Spinal stenosis, lumbar region without neurogenic claudication: Secondary | ICD-10-CM | POA: Insufficient documentation

## 2019-05-08 DIAGNOSIS — E119 Type 2 diabetes mellitus without complications: Secondary | ICD-10-CM | POA: Diagnosis not present

## 2019-05-08 DIAGNOSIS — G8929 Other chronic pain: Secondary | ICD-10-CM | POA: Diagnosis not present

## 2019-05-08 DIAGNOSIS — Z87442 Personal history of urinary calculi: Secondary | ICD-10-CM | POA: Insufficient documentation

## 2019-05-08 DIAGNOSIS — M546 Pain in thoracic spine: Secondary | ICD-10-CM | POA: Diagnosis not present

## 2019-05-08 MED ORDER — HYDROCODONE-ACETAMINOPHEN 7.5-325 MG PO TABS: 1.0000 | ORAL_TABLET | Freq: Four times a day (QID) | ORAL | 0 refills | Status: DC | PRN

## 2019-05-08 NOTE — Progress Notes (Signed)
Subjective:    Patient ID: Sean [name], male    DOB: 1946/01/16, 73 y.o.   MRN: [id number]  HPI: Sean [name] is a 73 y.o. male who returns for follow up appointment for chronic pain and medication refill. He states his pain is located in his mid- back. He rates his pain 5. His current exercise regime is walking and mowing with tractor mower.   Sean [name] forgot his medication bottle, narcotic contract was reviewed. He verbalizes understanding.   Sean [name] Morphine equivalent is 29.74 MME.  Last UDS was Performed on 10/03/2018, it was consistent.   Pain Inventory Average Pain 5 Pain Right Now 5 My pain is burning  In the last 24 hours, has pain interfered with the following? General activity 3 Relation with others 3 Enjoyment of life 3 What TIME of day is your pain at its worst? night Sleep (in general) Fair  Pain is worse with: bending and sitting Pain improves with: medication Relief from Meds: 5  Mobility walk without assistance how many minutes can you walk? 10 ability to climb steps?  yes do you drive?  yes  Function retired  Neuro/Psych bowel control problems  Prior Studies Any changes since last visit?  no  Physicians involved in your care Any changes since last visit?  no   No family history on file. Social History   Socioeconomic History  . Marital status: Married    Spouse name: Not on file  . Number of children: Not on file  . Years of education: Not on file  . Highest education level: Not on file  Occupational History  . Not on file  Social Needs  . Financial resource strain: Not on file  . Food insecurity    Worry: Not on file    Inability: Not on file  . Transportation needs    Medical: Not on file    Non-medical: Not on file  Tobacco Use  . Smoking status: Former Smoker    Types: Cigarettes  . Smokeless tobacco: Never Used  Substance and Sexual Activity  . Alcohol use: Not Currently  . Drug use: Never  . Sexual  activity: Not on file  Lifestyle  . Physical activity    Days per week: Not on file    Minutes per session: Not on file  . Stress: Not on file  Relationships  . Social Herbalist on phone: Not on file    Gets together: Not on file    Attends religious service: Not on file    Active member of club or organization: Not on file    Attends meetings of clubs or organizations: Not on file    Relationship status: Not on file  Other Topics Concern  . Not on file  Social History Narrative  . Not on file   Past Surgical History:  Procedure Laterality Date  . BACK SURGERY    . BRAIN SURGERY    . CHOLECYSTECTOMY    . KNEE ARTHROSCOPY    . NECK SURGERY    . SPINE SURGERY     Spinal Cord Stimulator  . THYROIDECTOMY     Past Medical History:  Diagnosis Date  . Diabetes ([place])   . Hypercholesteremia   . Hypertension   . Kidney stone   . Peptic ulcer disease    There were no vitals taken for this visit.  Opioid Risk Score:   Fall Risk Score:  `1  Depression screen [facility] 2/9  Depression screen [facility] 2/9 02/28/2019 01/02/2019  Decreased Interest 0 0  Down, Depressed, Hopeless 0 0  PHQ - 2 Score 0 0     Review of Systems  Constitutional: Negative.   HENT: Negative.   Eyes: Negative.   Respiratory: Negative.   Cardiovascular: Negative.   Gastrointestinal: Positive for abdominal pain, constipation and diarrhea.  Endocrine: Negative.   Genitourinary: Negative.   Musculoskeletal: Positive for back pain.  Skin: Negative.   Allergic/Immunologic: Negative.   Neurological: Negative.   Hematological: Negative.   Psychiatric/Behavioral: Negative.   All other systems reviewed and are negative.      Objective:   Physical Exam Vitals signs and nursing note reviewed.  Constitutional:      Appearance: Normal appearance.  Neck:     Musculoskeletal: Normal range of motion and neck supple.  Cardiovascular:     Rate and Rhythm: Normal rate and regular rhythm.     Pulses:  Normal pulses.     Heart sounds: Normal heart sounds.  Pulmonary:     Effort: Pulmonary effort is normal.     Breath sounds: Normal breath sounds.  Musculoskeletal:     Comments: Normal Muscle Bulk and Muscle Testing Reveals:  Upper Extremities: Full ROM and Muscle Strength 5/5 Thoracic Paraspinal Tenderness: T-7-T-9  Lower Extremities: Full ROM and Muscle Strength 5/5 Arises from Table with ease Narrow Based Gait   Skin:    General: Skin is warm and dry.  Neurological:     Mental Status: He is alert and oriented to person, place, and time.  Psychiatric:        Mood and Affect: Mood normal.        Behavior: Behavior normal.           Assessment & Plan:  1. Lumbar Post Laminectomy Syndrome: Continue HEP as Tolerated. Continue to Monitor.05/09/2019 2. Left Lumbar Radiculitis: Continue Nortriptyline. Continue to Monitor.  3. Chronic Pain Syndrome: S/P spinal stimulator by Dr. Ronnald Ramp. .  Refilled: Continue with Slow Weaning: Hydrocodone 7.5/325 mg one tablet every 6 hours as needed for moderate pain #115.  We will continue the opioid monitoring program, this consists of regular clinic visits, examinations, urine drug screen, pill counts as well as use of [place] Controlled Substance Reporting system. 4. Chronic bilateral thoracic Back Pain: Continue with HEP as tolerated. Continue current medication regimen. Continue to monitor.   15 minutes of face to face patient care time was spent during this visit. All questions were encouraged and answered.  F/U in 2 months.

## 2019-06-08 DIAGNOSIS — J301 Allergic rhinitis due to pollen: Secondary | ICD-10-CM | POA: Diagnosis not present

## 2019-06-08 DIAGNOSIS — E118 Type 2 diabetes mellitus with unspecified complications: Secondary | ICD-10-CM | POA: Diagnosis not present

## 2019-06-08 DIAGNOSIS — E063 Autoimmune thyroiditis: Secondary | ICD-10-CM | POA: Diagnosis not present

## 2019-06-08 DIAGNOSIS — E785 Hyperlipidemia, unspecified: Secondary | ICD-10-CM | POA: Diagnosis not present

## 2019-06-08 DIAGNOSIS — I10 Essential (primary) hypertension: Secondary | ICD-10-CM | POA: Diagnosis not present

## 2019-06-15 ENCOUNTER — Other Ambulatory Visit: Payer: Self-pay

## 2019-06-15 NOTE — Patient Outreach (Signed)
[facility]) Care Management  06/15/2019  Sean [name] [birth year] [id number]  Medication Adherence call to [place] spoke with patient he is past due on Metformin Er 500 mg he explain he is only taking 2 tablets in the morning because his sugar labels are steady and does not need to take 4 tablets a day  and was instructed by his doctor to take it this way. Mr. Lambertson is showing past due under [place].   [facility] Management Direct Dial [phone]  Fax [phone] [email or web address]

## 2019-07-03 ENCOUNTER — Other Ambulatory Visit: Payer: Self-pay

## 2019-07-03 ENCOUNTER — Encounter: Payer: Self-pay | Admitting: Registered Nurse

## 2019-07-03 ENCOUNTER — Encounter: Payer: Medicare Other | Attending: Physical Medicine & Rehabilitation | Admitting: Registered Nurse

## 2019-07-03 VITALS — BP 134/78 | HR 94 | Temp 95.0°F | Ht 67.0 in | Wt 201.0 lb

## 2019-07-03 DIAGNOSIS — E119 Type 2 diabetes mellitus without complications: Secondary | ICD-10-CM | POA: Insufficient documentation

## 2019-07-03 DIAGNOSIS — Z87442 Personal history of urinary calculi: Secondary | ICD-10-CM | POA: Insufficient documentation

## 2019-07-03 DIAGNOSIS — M546 Pain in thoracic spine: Secondary | ICD-10-CM | POA: Diagnosis not present

## 2019-07-03 DIAGNOSIS — M48061 Spinal stenosis, lumbar region without neurogenic claudication: Secondary | ICD-10-CM | POA: Insufficient documentation

## 2019-07-03 DIAGNOSIS — G894 Chronic pain syndrome: Secondary | ICD-10-CM

## 2019-07-03 DIAGNOSIS — M545 Low back pain: Secondary | ICD-10-CM | POA: Diagnosis not present

## 2019-07-03 DIAGNOSIS — G8929 Other chronic pain: Secondary | ICD-10-CM | POA: Diagnosis not present

## 2019-07-03 DIAGNOSIS — Z87891 Personal history of nicotine dependence: Secondary | ICD-10-CM | POA: Diagnosis not present

## 2019-07-03 DIAGNOSIS — M961 Postlaminectomy syndrome, not elsewhere classified: Secondary | ICD-10-CM | POA: Diagnosis not present

## 2019-07-03 DIAGNOSIS — I10 Essential (primary) hypertension: Secondary | ICD-10-CM | POA: Diagnosis not present

## 2019-07-03 DIAGNOSIS — Z79899 Other long term (current) drug therapy: Secondary | ICD-10-CM | POA: Diagnosis not present

## 2019-07-03 DIAGNOSIS — E7800 Pure hypercholesterolemia, unspecified: Secondary | ICD-10-CM | POA: Insufficient documentation

## 2019-07-03 DIAGNOSIS — Z5181 Encounter for therapeutic drug level monitoring: Secondary | ICD-10-CM

## 2019-07-03 DIAGNOSIS — Z981 Arthrodesis status: Secondary | ICD-10-CM | POA: Insufficient documentation

## 2019-07-03 MED ORDER — HYDROCODONE-ACETAMINOPHEN 7.5-325 MG PO TABS: 1.0000 | ORAL_TABLET | Freq: Four times a day (QID) | ORAL | 0 refills | Status: DC | PRN

## 2019-07-03 NOTE — Progress Notes (Signed)
Subjective:    Patient ID: Sean [name], male    DOB: 07-27-1946, 73 y.o.   MRN: [id number]  HPI: Jaquawn L Holthusen is a 73 y.o. male who returns for follow up appointment for chronic pain and medication refill. He states his pain is located in his mid- back. He  Rates his pain 5. His current exercise regime is walking and outside chores.  Mr. Kmetz Morphine equivalent is 29.74 MME.  Last UDS was Performed on 10/03/2018, it was consistent.  Pain Inventory Average Pain 5 Pain Right Now 5 My pain is aching  In the last 24 hours, has pain interfered with the following? General activity 2 Relation with others 2 Enjoyment of life 4 What TIME of day is your pain at its worst? night Sleep (in general) Poor  Pain is worse with: . Pain improves with: rest and medication Relief from Meds: 5  Mobility walk without assistance ability to climb steps?  no do you drive?  yes  Function disabled: date disabled .  Neuro/Psych bowel control problems spasms  Prior Studies Any changes since last visit?  no  Physicians involved in your care Any changes since last visit?  no   No family history on file. Social History   Socioeconomic History  . Marital status: Married    Spouse name: Not on file  . Number of children: Not on file  . Years of education: Not on file  . Highest education level: Not on file  Occupational History  . Not on file  Social Needs  . Financial resource strain: Not on file  . Food insecurity    Worry: Not on file    Inability: Not on file  . Transportation needs    Medical: Not on file    Non-medical: Not on file  Tobacco Use  . Smoking status: Former Smoker    Types: Cigarettes  . Smokeless tobacco: Never Used  Substance and Sexual Activity  . Alcohol use: Not Currently  . Drug use: Never  . Sexual activity: Not on file  Lifestyle  . Physical activity    Days per week: Not on file    Minutes per session: Not on file  . Stress: Not on  file  Relationships  . Social Herbalist on phone: Not on file    Gets together: Not on file    Attends religious service: Not on file    Active member of club or organization: Not on file    Attends meetings of clubs or organizations: Not on file    Relationship status: Not on file  Other Topics Concern  . Not on file  Social History Narrative  . Not on file   Past Surgical History:  Procedure Laterality Date  . BACK SURGERY    . BRAIN SURGERY    . CHOLECYSTECTOMY    . KNEE ARTHROSCOPY    . NECK SURGERY    . SPINE SURGERY     Spinal Cord Stimulator  . THYROIDECTOMY     Past Medical History:  Diagnosis Date  . Diabetes ([place])   . Hypercholesteremia   . Hypertension   . Kidney stone   . Peptic ulcer disease    BP 134/78   Pulse 94   Temp (!) 95 F (35 C)   Ht 5\' 7"  (1.702 m)   Wt 201 lb (91.2 kg)   SpO2 95%   BMI 31.48 kg/m   Opioid Risk Score:   Fall  Risk Score:  `1  Depression screen PHQ 2/9  Depression screen [facility] 2/9 02/28/2019 01/02/2019  Decreased Interest 0 0  Down, Depressed, Hopeless 0 0  PHQ - 2 Score 0 0     Review of Systems  Constitutional: Negative.   HENT: Negative.   Eyes: Negative.   Respiratory: Negative.   Cardiovascular: Negative.   Gastrointestinal: Positive for diarrhea.  Endocrine: Negative.   Genitourinary: Positive for difficulty urinating.  Musculoskeletal: Positive for arthralgias, back pain and myalgias.  Skin: Negative.   Allergic/Immunologic: Negative.   Neurological: Negative.   Hematological: Negative.   Psychiatric/Behavioral: Negative.   All other systems reviewed and are negative.      Objective:   Physical Exam Vitals signs and nursing note reviewed.  Constitutional:      Appearance: Normal appearance.  Neck:     Musculoskeletal: Normal range of motion and neck supple.  Cardiovascular:     Rate and Rhythm: Normal rate and regular rhythm.     Pulses: Normal pulses.     Heart sounds: Normal heart  sounds.  Pulmonary:     Effort: Pulmonary effort is normal.     Breath sounds: Normal breath sounds.  Musculoskeletal:     Comments: Normal Muscle Bulk and Muscle Testing Reveals:  Upper Extremities: Full ROM and Muscle Strength 5/5 Thoracic Paraspinal Tenderness: T-7-T-9  Lower Extremities:: Full ROM and Muscle Strength 5/5 Arises from Table with ease Narrow Based Gait   Skin:    General: Skin is warm and dry.  Neurological:     Mental Status: He is alert and oriented to person, place, and time.  Psychiatric:        Mood and Affect: Mood normal.        Behavior: Behavior normal.           Assessment & Plan:  1. Lumbar Post Laminectomy Syndrome: Continue HEP as Tolerated. Continue to Monitor.07/04/2019 2. Left Lumbar Radiculitis: Continue Nortriptyline. Continue to Monitor. 07/04/2019. 3. Chronic Pain Syndrome:S/Pspinal stimulator by Dr. Ronnald Ramp..  Refilled:Continue with Slow Weaning:Hydrocodone 7.5/325 mg one tablet every 6 hours as needed for moderate pain #115.We will continue the opioid monitoring program, this consists of regular clinic visits, examinations, urine drug screen, pill counts as well as use of [place] Controlled Substance Reporting system. 4. Chronic bilateral thoracic Back Pain: Continue with HEP as tolerated. Continue current medication regimen. Continue to monitor. 07/04/2019  15 minutes of face to face patient care time was spent during this visit. All questions were encouraged and answered.  F/U in 2 months.

## 2019-08-31 ENCOUNTER — Encounter: Payer: Medicare Other | Admitting: Registered Nurse

## 2019-08-31 ENCOUNTER — Telehealth: Payer: Self-pay | Admitting: Registered Nurse

## 2019-08-31 DIAGNOSIS — M961 Postlaminectomy syndrome, not elsewhere classified: Secondary | ICD-10-CM

## 2019-08-31 MED ORDER — HYDROCODONE-ACETAMINOPHEN 7.5-325 MG PO TABS: 1.0000 | ORAL_TABLET | Freq: Four times a day (QID) | ORAL | 0 refills | Status: DC | PRN

## 2019-08-31 NOTE — Telephone Encounter (Signed)
Due to staffing patient appointment moved to 09/20/19. Please call in Hydrocodone.

## 2019-08-31 NOTE — Telephone Encounter (Signed)
PMP was Reviewed: Hydrocodone e-scribed. Placed a call to Sean [name] spoke with his daughter regarding the above, she verbalizes understanding.

## 2019-09-20 ENCOUNTER — Encounter: Payer: Medicare Other | Attending: Physical Medicine & Rehabilitation | Admitting: Registered Nurse

## 2019-09-20 ENCOUNTER — Other Ambulatory Visit: Payer: Self-pay

## 2019-09-20 DIAGNOSIS — G894 Chronic pain syndrome: Secondary | ICD-10-CM | POA: Diagnosis not present

## 2019-09-20 DIAGNOSIS — I10 Essential (primary) hypertension: Secondary | ICD-10-CM | POA: Insufficient documentation

## 2019-09-20 DIAGNOSIS — E119 Type 2 diabetes mellitus without complications: Secondary | ICD-10-CM | POA: Insufficient documentation

## 2019-09-20 DIAGNOSIS — Z79891 Long term (current) use of opiate analgesic: Secondary | ICD-10-CM

## 2019-09-20 DIAGNOSIS — E7800 Pure hypercholesterolemia, unspecified: Secondary | ICD-10-CM | POA: Insufficient documentation

## 2019-09-20 DIAGNOSIS — M5416 Radiculopathy, lumbar region: Secondary | ICD-10-CM | POA: Diagnosis not present

## 2019-09-20 DIAGNOSIS — Z5181 Encounter for therapeutic drug level monitoring: Secondary | ICD-10-CM

## 2019-09-20 DIAGNOSIS — M961 Postlaminectomy syndrome, not elsewhere classified: Secondary | ICD-10-CM | POA: Diagnosis not present

## 2019-09-20 DIAGNOSIS — M545 Low back pain: Secondary | ICD-10-CM | POA: Insufficient documentation

## 2019-09-20 DIAGNOSIS — Z87442 Personal history of urinary calculi: Secondary | ICD-10-CM | POA: Insufficient documentation

## 2019-09-20 DIAGNOSIS — Z23 Encounter for immunization: Secondary | ICD-10-CM | POA: Diagnosis not present

## 2019-09-20 DIAGNOSIS — M546 Pain in thoracic spine: Secondary | ICD-10-CM

## 2019-09-20 DIAGNOSIS — Z87891 Personal history of nicotine dependence: Secondary | ICD-10-CM | POA: Insufficient documentation

## 2019-09-20 DIAGNOSIS — Z981 Arthrodesis status: Secondary | ICD-10-CM | POA: Insufficient documentation

## 2019-09-20 DIAGNOSIS — G8929 Other chronic pain: Secondary | ICD-10-CM

## 2019-09-20 DIAGNOSIS — M48061 Spinal stenosis, lumbar region without neurogenic claudication: Secondary | ICD-10-CM | POA: Insufficient documentation

## 2019-09-20 MED ORDER — HYDROCODONE-ACETAMINOPHEN 7.5-325 MG PO TABS: 1.0000 | ORAL_TABLET | Freq: Four times a day (QID) | ORAL | 0 refills | Status: DC | PRN

## 2019-09-20 NOTE — Progress Notes (Signed)
Subjective:    Patient ID: Sean [name], male    DOB: [birth year], 73 y.o.   MRN: [id number]  HPI: Sean [name] is a 73 y.o. male  his appointment was changed to a virtual office visit to reduce the risk of exposure to the COVID-19 virus and to help Sean [name] remain healthy and safe. The virtual visit will also provide continuity of care. Sean [name] agrees with virtual visit and verbalizes understanding. He states his pain is located in his mid- back. He rates his pain 5. His current exercise regime is walking and performing outside chores.   Mtr. Niziolek Morphine equivalent is 29.74  MME.  Last UDS was Performed on 10/03/2018, it was consistent.   Wenda Overland RN asked the Health and History Questions. This provider and Wenda Overland verified we were speaking with the correct person using two identifiers.    Pain Inventory Average Pain 5 Pain Right Now 5 My pain is aching  In the last 24 hours, has pain interfered with the following? General activity 2 Relation with others 2 Enjoyment of life 4 What TIME of day is your pain at its worst? night Sleep (in general) Poor  Pain is worse with: sitting and some activites Pain improves with: rest, medication and hot bath Relief from Meds: 7  Mobility walk without assistance do you drive?  yes  Function disabled: date disabled .  Neuro/Psych No problems in this area  Prior Studies Any changes since last visit?  no  Physicians involved in your care Any changes since last visit?  no   No family history on file. Social History   Socioeconomic History  . Marital status: Married    Spouse name: Not on file  . Number of children: Not on file  . Years of education: Not on file  . Highest education level: Not on file  Occupational History  . Not on file  Tobacco Use  . Smoking status: Former Smoker    Types: Cigarettes  . Smokeless tobacco: Never Used  Substance and Sexual Activity  . Alcohol use: Not  Currently  . Drug use: Never  . Sexual activity: Not on file  Other Topics Concern  . Not on file  Social History Narrative  . Not on file   Social Determinants of Health   Financial Resource Strain:   . Difficulty of Paying Living Expenses: Not on file  Food Insecurity:   . Worried About Charity fundraiser in the Last Year: Not on file  . Ran Out of Food in the Last Year: Not on file  Transportation Needs:   . Lack of Transportation (Medical): Not on file  . Lack of Transportation (Non-Medical): Not on file  Physical Activity:   . Days of Exercise per Week: Not on file  . Minutes of Exercise per Session: Not on file  Stress:   . Feeling of Stress : Not on file  Social Connections:   . Frequency of Communication with Friends and Family: Not on file  . Frequency of Social Gatherings with Friends and Family: Not on file  . Attends Religious Services: Not on file  . Active Member of Clubs or Organizations: Not on file  . Attends Archivist Meetings: Not on file  . Marital Status: Not on file   Past Surgical History:  Procedure Laterality Date  . BACK SURGERY    . BRAIN SURGERY    . CHOLECYSTECTOMY    . KNEE ARTHROSCOPY    .  NECK SURGERY    . SPINE SURGERY     Spinal Cord Stimulator  . THYROIDECTOMY     Past Medical History:  Diagnosis Date  . Diabetes ([place])   . Hypercholesteremia   . Hypertension   . Kidney stone   . Peptic ulcer disease    There were no vitals taken for this visit.  Opioid Risk Score:   Fall Risk Score:  `1  Depression screen PHQ 2/9  Depression screen [facility] 2/9 09/20/2019 02/28/2019 01/02/2019  Decreased Interest 0 0 0  Down, Depressed, Hopeless 0 0 0  PHQ - 2 Score 0 0 0    Review of Systems  Constitutional: Negative.   HENT: Negative.   Eyes: Negative.   Respiratory: Negative.   Cardiovascular: Negative.   Gastrointestinal: Negative.   Endocrine: Negative.   Genitourinary: Negative.   Musculoskeletal: Positive for back  pain.       Has been hurting more mid back lately esp this week  Skin: Negative.   Allergic/Immunologic: Negative.   Neurological: Negative.   Hematological: Negative.   Psychiatric/Behavioral: Positive for sleep disturbance.       Not sleeping and nortriptyline does not work  All other systems reviewed and are negative.      Objective:   Physical Exam Vitals and nursing note reviewed.  Musculoskeletal:     Comments: No Physical Exam: Virtual Visit           Assessment & Plan:  1. Lumbar Post Laminectomy Syndrome:Chronic Bilateral Thoracic Back Pain:  Continue HEP as Tolerated. Continue to Monitor. 09/20/2019 2. Left Lumbar Radiculitis: No complaints today. Continue Nortriptyline. Continue to Monitor.  3. Chronic Pain Syndrome: Refilled: Hydrocodone 7.5/325 mg one tablet every 6 hours as needed for moderate pain #120. Second script sent for the following month. We will continue the opioid monitoring program, this consists of regular clinic visits, examinations, urine drug screen, pill counts as well as use of [place] Controlled Substance Reporting system.  F/U in 2 months.  VIRTUAL VISIT - Established Patient Location of patient: In his home Location of provider: Office Time spent on call: 10 Minutes

## 2019-09-24 ENCOUNTER — Encounter: Payer: Self-pay | Admitting: Registered Nurse

## 2019-10-18 IMAGING — MR MRI THORACIC SPINE WITHOUT CONTRAST
4 of 5 series · 16 of 48 positions shown · non-contrast
Comparison: Prior MRI from 08/19/2016.

CLINICAL DATA: Initial evaluation for chronic spine pain for years,
no history of multiple previous surgeries.

EXAM:
MRI THORACIC SPINE WITHOUT CONTRAST
TECHNIQUE: Multiplanar, multisequence MR imaging of the thoracic spine was
performed. No intravenous contrast was administered.

[Series 5: T2 · sagittal · 3.5mm · 0.52mm/px · 6 of 15 slices shown (1 of 2)]
[im 1/15]
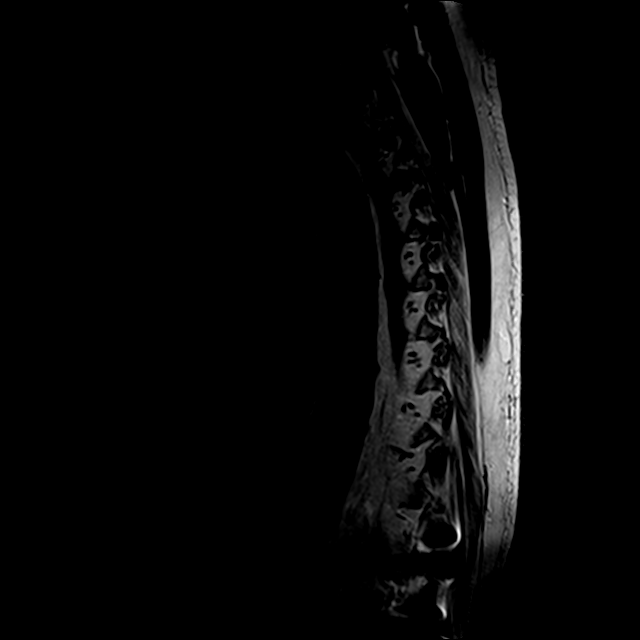
[im 3/15]
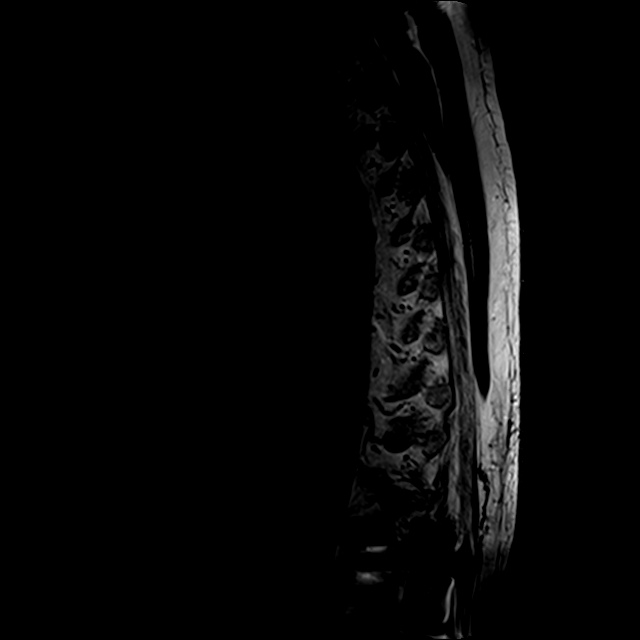
[im 6/15]
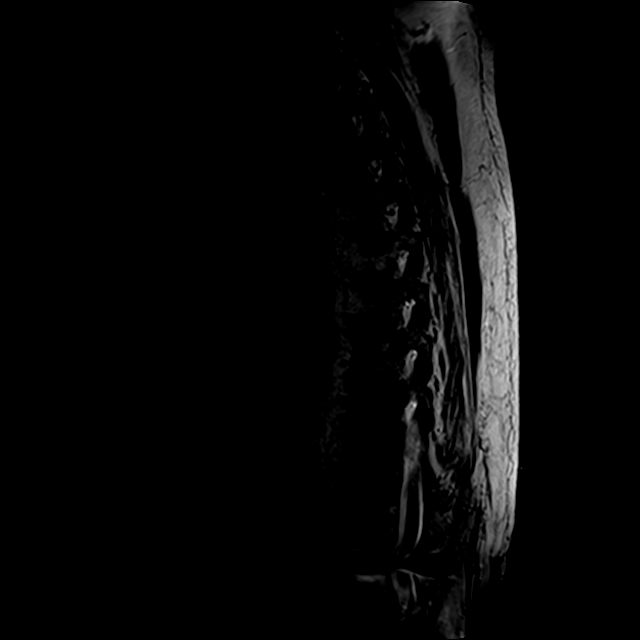
[im 9/15]
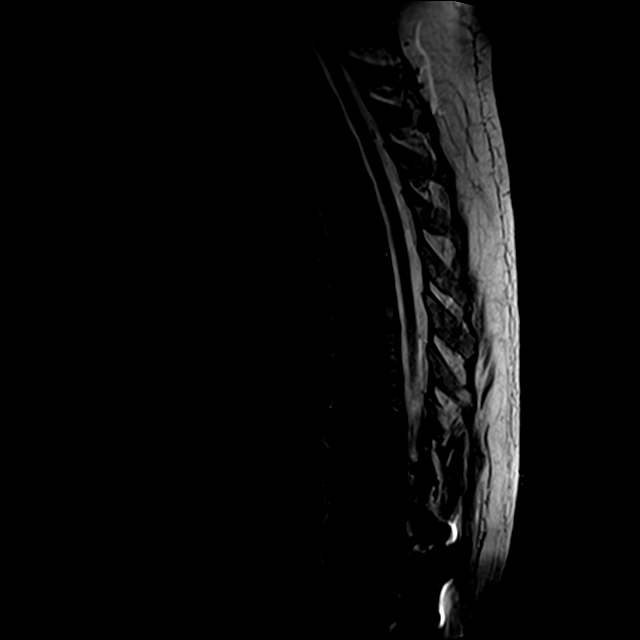
[im 12/15]
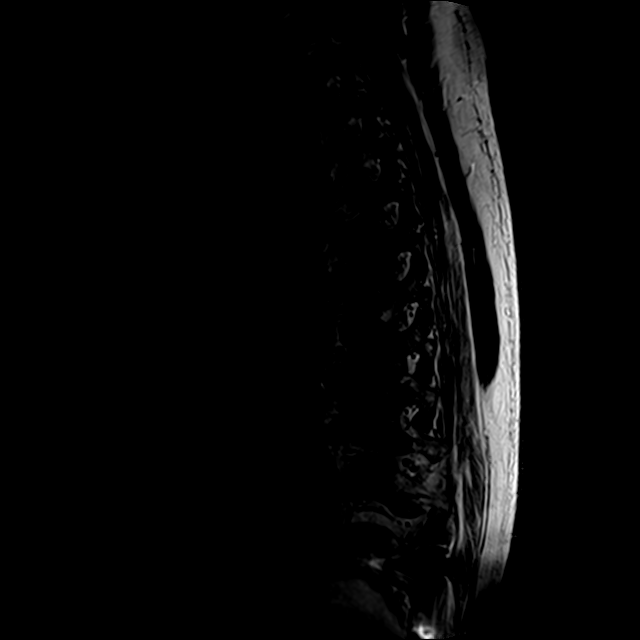
[im 15/15]
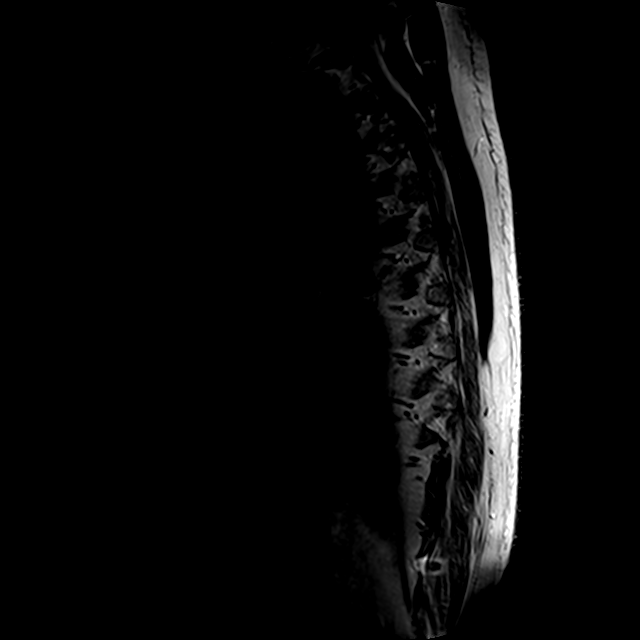

[Series 6: STIR · sagittal · 3.5mm · 1.03mm/px · 3 of 15 slices shown]
[im 3/15]
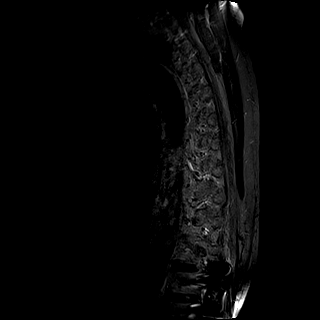
[im 9/15]
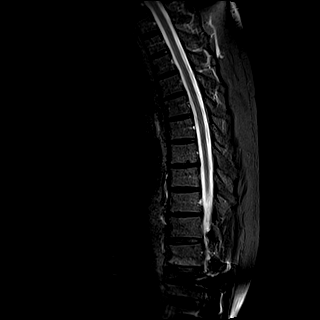
[im 15/15]
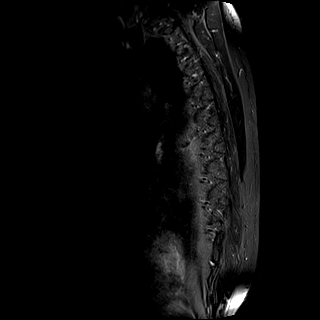

[Series 7: T1 · sagittal · 3.5mm · 0.52mm/px · 3 of 15 slices shown]
[im 3/15]
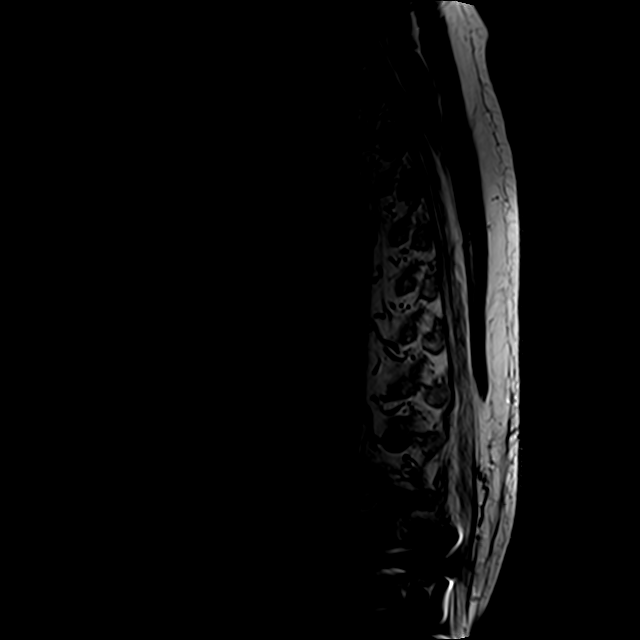
[im 9/15]
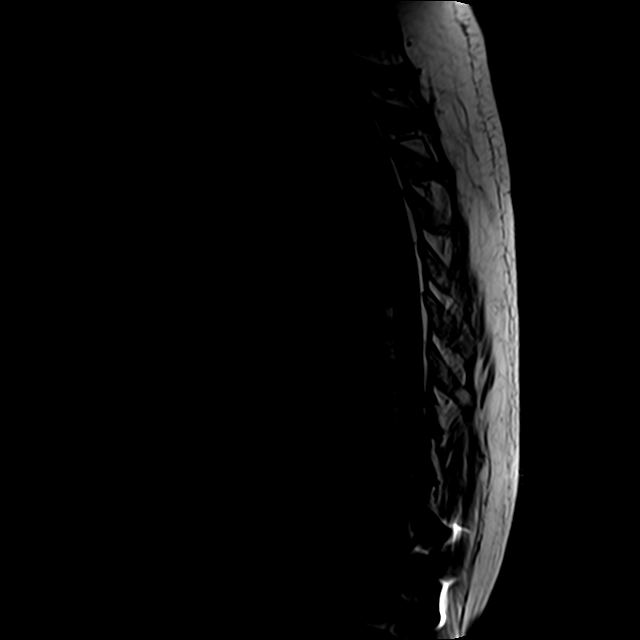
[im 15/15]
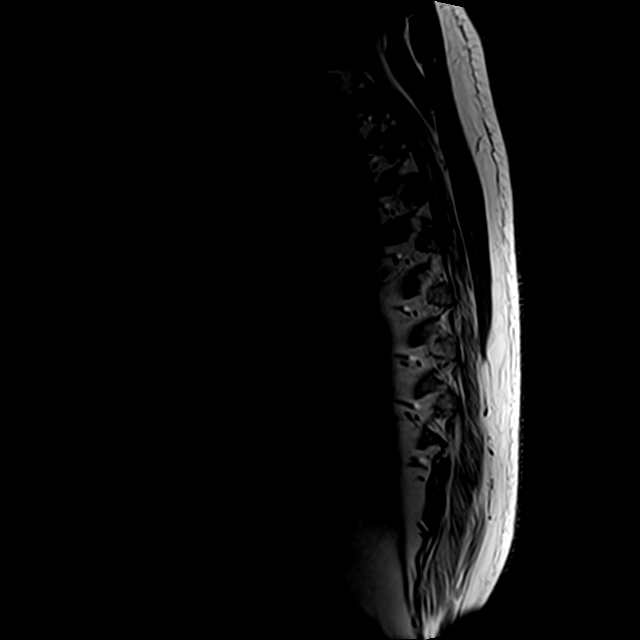

[Series 8: T2 · axial · 4.0mm · 0.39mm/px · z∈[-339,-130]mm · 4 of 36 slices shown (2 of 2)]
[im 1/36]
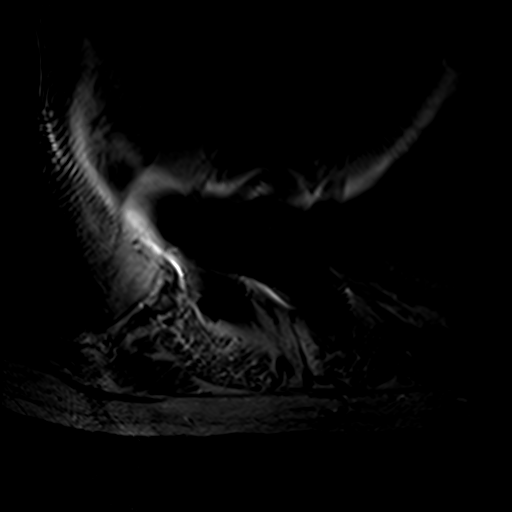
[im 6/36]
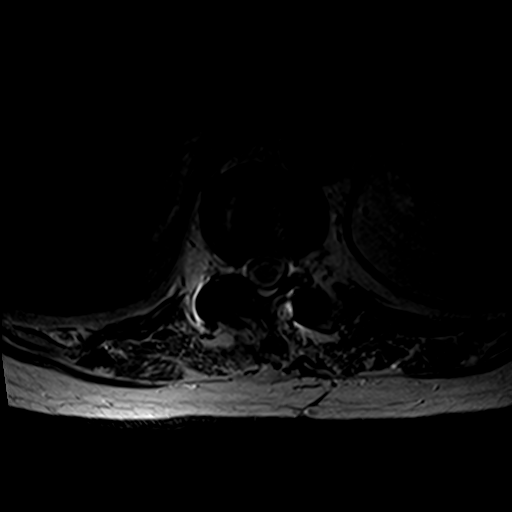
[im 18/36]
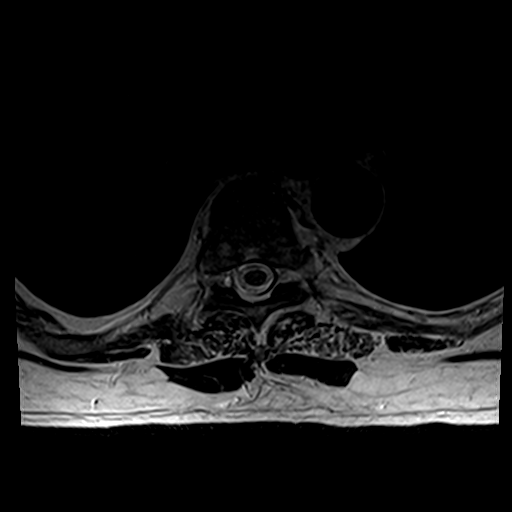
[im 31/36]
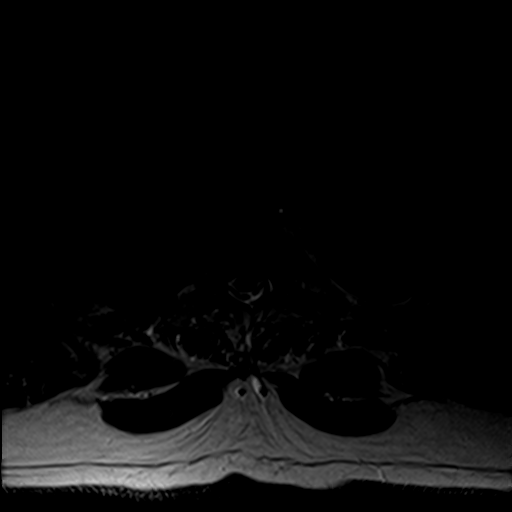

[16 of 48 positions shown; findings below may reference images not displayed]

FINDINGS: Alignment: Vertebral bodies normally aligned with preservation of
the normal thoracic kyphosis. No listhesis or malalignment.

Vertebrae: Susceptibility artifact from prior posterior spinal
fusion at T12-L1, somewhat limiting evaluation at these levels.
Vertebral body height maintained without evidence for acute or
interval fracture. Small chronic endplate Schmorl's node noted at
the inferior endplate of T7 without associated marrow edema, stable.
Underlying bone marrow signal intensity is normal. Few scattered
subcentimeter benign hemangiomas noted. No other discrete or
worrisome osseous lesions. No abnormal marrow edema.

Cord: Signal intensity within the cervical spinal cord is normal.
Normal cord caliber and morphology. Lower thoracic cord obscured by
susceptibility artifact.

Paraspinal and other soft tissues: Visualized paraspinous soft
tissues within normal limits. Partially visualized lungs are clear.
Visualized visceral structures unremarkable.

Disc levels:

T1-2:  Mild facet hypertrophy.  No stenosis.

T2-3: Unremarkable.

T3-4:  Unremarkable.

T4-5:  Mild facet hypertrophy.  No stenosis.

T5-6:  Mild facet hypertrophy.  No stenosis.

T6-7:  Unremarkable.

T7-8: Tiny central disc protrusion minimally indents the ventral
thecal sac. No stenosis. This is unchanged.

T8-9:  Unremarkable.

T9-10: Small central disc protrusion minimally indents the ventral
thecal sac. Mild bilateral facet hypertrophy. No significant spinal
stenosis. Mild left foraminal narrowing. Appearance is stable.

T10-11: Mild to moderate bilateral facet hypertrophy, greater on the
right. Resultant mild to moderate right T10 foraminal stenosis,
stable. No spinal stenosis at this level.

T11-12: Partially obscured by susceptibility artifact.
Mild-to-moderate bilateral facet hypertrophy. No appreciable
stenosis.

T12-L1: Partially obscured by susceptibility artifact. No
appreciable stenosis.
IMPRESSION: 1. Stable appearance of the thoracic spine as compared to previous
MRI from 08/19/2016.
[DATE]. Mild to moderate lower thoracic facet hypertrophy with resultant
mild left T9 and mild to moderate right T10 foraminal stenosis.
3. Subtle small disc herniations at T7-8 and T9-10 without stenosis.
4. Posterior transpedicular screw fixation at T12-L1, stable.

## 2019-11-20 ENCOUNTER — Other Ambulatory Visit: Payer: Self-pay

## 2019-11-20 ENCOUNTER — Encounter: Payer: Self-pay | Admitting: Registered Nurse

## 2019-11-20 ENCOUNTER — Encounter: Payer: Medicare Other | Attending: Physical Medicine & Rehabilitation | Admitting: Registered Nurse

## 2019-11-20 VITALS — BP 129/97 | HR 88 | Temp 97.5°F | Ht 67.0 in | Wt 201.4 lb

## 2019-11-20 DIAGNOSIS — Z5181 Encounter for therapeutic drug level monitoring: Secondary | ICD-10-CM | POA: Insufficient documentation

## 2019-11-20 DIAGNOSIS — I10 Essential (primary) hypertension: Secondary | ICD-10-CM | POA: Diagnosis not present

## 2019-11-20 DIAGNOSIS — M48061 Spinal stenosis, lumbar region without neurogenic claudication: Secondary | ICD-10-CM | POA: Insufficient documentation

## 2019-11-20 DIAGNOSIS — E119 Type 2 diabetes mellitus without complications: Secondary | ICD-10-CM | POA: Diagnosis not present

## 2019-11-20 DIAGNOSIS — Z79899 Other long term (current) drug therapy: Secondary | ICD-10-CM

## 2019-11-20 DIAGNOSIS — G894 Chronic pain syndrome: Secondary | ICD-10-CM | POA: Insufficient documentation

## 2019-11-20 DIAGNOSIS — M961 Postlaminectomy syndrome, not elsewhere classified: Secondary | ICD-10-CM | POA: Insufficient documentation

## 2019-11-20 DIAGNOSIS — G8929 Other chronic pain: Secondary | ICD-10-CM | POA: Diagnosis not present

## 2019-11-20 DIAGNOSIS — E7800 Pure hypercholesterolemia, unspecified: Secondary | ICD-10-CM | POA: Insufficient documentation

## 2019-11-20 DIAGNOSIS — M545 Low back pain: Secondary | ICD-10-CM | POA: Insufficient documentation

## 2019-11-20 DIAGNOSIS — Z981 Arthrodesis status: Secondary | ICD-10-CM | POA: Diagnosis not present

## 2019-11-20 DIAGNOSIS — Z87891 Personal history of nicotine dependence: Secondary | ICD-10-CM | POA: Diagnosis not present

## 2019-11-20 DIAGNOSIS — M546 Pain in thoracic spine: Secondary | ICD-10-CM | POA: Diagnosis not present

## 2019-11-20 DIAGNOSIS — Z87442 Personal history of urinary calculi: Secondary | ICD-10-CM | POA: Diagnosis not present

## 2019-11-20 MED ORDER — HYDROCODONE-ACETAMINOPHEN 7.5-325 MG PO TABS: 1.0000 | ORAL_TABLET | Freq: Four times a day (QID) | ORAL | 0 refills | Status: DC | PRN

## 2019-11-20 NOTE — Progress Notes (Signed)
Subjective:    Patient ID: Sean [name], male    DOB: May 26, 1946, 74 y.o.   MRN: [id number]  HPI: Sean [name] is a 74 y.o. male who returns for follow up appointment for chronic pain and medication refill. He states his pain is located in his mid-back. He rates his pain 5. His current exercise regime is walking and performing stretching exercises.  Mr. Skillin reports three weeks he lost his footing when he was getting on his tractor and landed on his buttocks. He reports he was seen by his PCP. Educated on falls prevention, he verbalizes understanding.   Mr. Staib Morphine equivalent is 29.74  MME.  Oral Swab was Performed Today.   Pain Inventory Average Pain 5 Pain Right Now 5 My pain is constant and aching  In the last 24 hours, has pain interfered with the following? General activity 5 Relation with others 3 Enjoyment of life 5 What TIME of day is your pain at its worst? night Sleep (in general) Fair  Pain is worse with: walking and standing Pain improves with: medication Relief from Meds: 5  Mobility how many minutes can you walk? 5 ability to climb steps?  yes do you drive?  yes  Function disabled: date disabled .  Neuro/Psych bowel control problems numbness spasms  Prior Studies Any changes since last visit?  no  Physicians involved in your care Any changes since last visit?  no   No family history on file. Social History   Socioeconomic History  . Marital status: Married    Spouse name: Not on file  . Number of children: Not on file  . Years of education: Not on file  . Highest education level: Not on file  Occupational History  . Not on file  Tobacco Use  . Smoking status: Former Smoker    Types: Cigarettes  . Smokeless tobacco: Never Used  Substance and Sexual Activity  . Alcohol use: Not Currently  . Drug use: Never  . Sexual activity: Not on file  Other Topics Concern  . Not on file  Social History Narrative  . Not on file    Social Determinants of Health   Financial Resource Strain:   . Difficulty of Paying Living Expenses: Not on file  Food Insecurity:   . Worried About Charity fundraiser in the Last Year: Not on file  . Ran Out of Food in the Last Year: Not on file  Transportation Needs:   . Lack of Transportation (Medical): Not on file  . Lack of Transportation (Non-Medical): Not on file  Physical Activity:   . Days of Exercise per Week: Not on file  . Minutes of Exercise per Session: Not on file  Stress:   . Feeling of Stress : Not on file  Social Connections:   . Frequency of Communication with Friends and Family: Not on file  . Frequency of Social Gatherings with Friends and Family: Not on file  . Attends Religious Services: Not on file  . Active Member of Clubs or Organizations: Not on file  . Attends Archivist Meetings: Not on file  . Marital Status: Not on file   Past Surgical History:  Procedure Laterality Date  . BACK SURGERY    . BRAIN SURGERY    . CHOLECYSTECTOMY    . KNEE ARTHROSCOPY    . NECK SURGERY    . SPINE SURGERY     Spinal Cord Stimulator  . THYROIDECTOMY  Past Medical History:  Diagnosis Date  . Diabetes ([place])   . Hypercholesteremia   . Hypertension   . Kidney stone   . Peptic ulcer disease    BP (!) 129/97   Pulse 88   Temp (!) 97.5 F (36.4 C)   Ht 5\' 7"  (1.702 m)   Wt 201 lb 6.4 oz (91.4 kg)   SpO2 94%   BMI 31.54 kg/m   Opioid Risk Score:   Fall Risk Score:  `1  Depression screen PHQ 2/9  Depression screen [facility] 2/9 09/20/2019 02/28/2019 01/02/2019  Decreased Interest 0 0 0  Down, Depressed, Hopeless 0 0 0  PHQ - 2 Score 0 0 0    Review of Systems     Objective:   Physical Exam Vitals and nursing note reviewed.  Constitutional:      Appearance: Normal appearance.  Cardiovascular:     Rate and Rhythm: Normal rate and regular rhythm.     Pulses: Normal pulses.     Heart sounds: Normal heart sounds.  Pulmonary:     Effort:  Pulmonary effort is normal.     Breath sounds: Normal breath sounds.  Musculoskeletal:     Cervical back: Normal range of motion and neck supple.     Comments: Normal Muscle Bulk and Muscle Testing Reveals:  Upper Extremities: Full ROM and Muscle Strength 5/5 Thoracic Paraspinal Tenderness: T-7-T-9 Lower Extremities: Full ROM and Muscle Strength 5/5 Arises from Table with ease Narrow Based  Gait   Skin:    General: Skin is warm and dry.  Neurological:     Mental Status: He is alert and oriented to person, place, and time.  Psychiatric:        Mood and Affect: Mood normal.        Behavior: Behavior normal.           Assessment & Plan:  1. Lumbar Post Laminectomy Syndrome:Chronic Bilateral Thoracic Back Pain:  Continue HEP as Tolerated. Continue to Monitor.11/19/2018 2. Left Lumbar Radiculitis: No complaints today. Continue Nortriptyline. Continue to Monitor. 11/20/2019 3. Chronic Pain Syndrome: Refilled: Hydrocodone 7.5/325 mg one tablet every 6 hours as needed for moderate pain #120. Second script sent for the following month.11/20/2019. We will continue the opioid monitoring program, this consists of regular clinic visits, examinations, urine drug screen, pill counts as well as use of [place] Controlled Substance Reporting system.  F/U in 2 months.  15 minutes of face to face patient care time was spent during this visit. All questions were encouraged and answered.

## 2019-11-24 LAB — DRUG TOX MONITOR 1 W/CONF, ORAL FLD
Amphetamines: NEGATIVE ng/mL (ref <10)
Barbiturates: NEGATIVE ng/mL (ref <10)
Benzodiazepines: NEGATIVE ng/mL (ref <0.50)
Buprenorphine: NEGATIVE ng/mL (ref <0.10)
Cocaine: NEGATIVE ng/mL (ref <5.0)
Codeine: NEGATIVE ng/mL (ref <2.5)
Dihydrocodeine: 10 ng/mL — ABNORMAL HIGH (ref <2.5)
Fentanyl: NEGATIVE ng/mL (ref <0.10)
Heroin Metabolite: NEGATIVE ng/mL (ref <1.0)
Hydrocodone: 73.5 ng/mL — ABNORMAL HIGH (ref <2.5)
Hydromorphone: NEGATIVE ng/mL (ref <2.5)
MARIJUANA: NEGATIVE ng/mL (ref <2.5)
MDMA: NEGATIVE ng/mL (ref <10)
Meprobamate: NEGATIVE ng/mL (ref <2.5)
Methadone: NEGATIVE ng/mL (ref <5.0)
Morphine: NEGATIVE ng/mL (ref <2.5)
Nicotine Metabolite: NEGATIVE ng/mL (ref <5.0)
Norhydrocodone: NEGATIVE ng/mL (ref <2.5)
Noroxycodone: NEGATIVE ng/mL (ref <2.5)
Opiates: POSITIVE ng/mL — AB (ref <2.5)
Oxycodone: NEGATIVE ng/mL (ref <2.5)
Oxymorphone: NEGATIVE ng/mL (ref <2.5)
Phencyclidine: NEGATIVE ng/mL (ref <10)
Tapentadol: NEGATIVE ng/mL (ref <5.0)
Tramadol: NEGATIVE ng/mL (ref <5.0)
Zolpidem: NEGATIVE ng/mL (ref <5.0)

## 2019-11-24 LAB — DRUG TOX ALC METAB W/CON, ORAL FLD: Alcohol Metabolite: NEGATIVE ng/mL (ref <25)

## 2019-12-04 ENCOUNTER — Telehealth: Payer: Self-pay

## 2019-12-04 NOTE — Telephone Encounter (Signed)
Oral swab drug screen was consistent for prescribed medications.  ?

## 2019-12-14 ENCOUNTER — Other Ambulatory Visit: Payer: Self-pay

## 2019-12-14 NOTE — Patient Outreach (Signed)
[facility]) Care Management  12/14/2019  [name] 1946/02/11 [id number]   Medication Adherence call to Mr. Sean [name] [employer] Verify spoke with patient he is past due on Metformin Er 500 mg,patient explain he is only taking 2 tablets daily not 4 tablets,patient said he has medication ,patient will go over it with his new doctor on his next appointment. [name] is showing past due under [name].   [facility] Management Direct Dial [phone]  Fax [phone] [email or web address]

## 2020-01-23 ENCOUNTER — Other Ambulatory Visit: Payer: Self-pay

## 2020-01-23 ENCOUNTER — Encounter: Payer: Medicare Other | Attending: Physical Medicine & Rehabilitation | Admitting: Registered Nurse

## 2020-01-23 ENCOUNTER — Encounter: Payer: Self-pay | Admitting: Registered Nurse

## 2020-01-23 VITALS — BP 112/71 | HR 84 | Temp 97.5°F | Ht 67.0 in | Wt 194.0 lb

## 2020-01-23 DIAGNOSIS — Z981 Arthrodesis status: Secondary | ICD-10-CM | POA: Insufficient documentation

## 2020-01-23 DIAGNOSIS — G894 Chronic pain syndrome: Secondary | ICD-10-CM | POA: Diagnosis not present

## 2020-01-23 DIAGNOSIS — M48061 Spinal stenosis, lumbar region without neurogenic claudication: Secondary | ICD-10-CM | POA: Insufficient documentation

## 2020-01-23 DIAGNOSIS — I10 Essential (primary) hypertension: Secondary | ICD-10-CM | POA: Insufficient documentation

## 2020-01-23 DIAGNOSIS — M961 Postlaminectomy syndrome, not elsewhere classified: Secondary | ICD-10-CM | POA: Diagnosis not present

## 2020-01-23 DIAGNOSIS — M545 Low back pain: Secondary | ICD-10-CM | POA: Insufficient documentation

## 2020-01-23 DIAGNOSIS — M546 Pain in thoracic spine: Secondary | ICD-10-CM

## 2020-01-23 DIAGNOSIS — Z79899 Other long term (current) drug therapy: Secondary | ICD-10-CM | POA: Diagnosis not present

## 2020-01-23 DIAGNOSIS — E7800 Pure hypercholesterolemia, unspecified: Secondary | ICD-10-CM | POA: Diagnosis not present

## 2020-01-23 DIAGNOSIS — G8929 Other chronic pain: Secondary | ICD-10-CM | POA: Diagnosis not present

## 2020-01-23 DIAGNOSIS — Z5181 Encounter for therapeutic drug level monitoring: Secondary | ICD-10-CM

## 2020-01-23 DIAGNOSIS — Z87442 Personal history of urinary calculi: Secondary | ICD-10-CM | POA: Insufficient documentation

## 2020-01-23 DIAGNOSIS — E119 Type 2 diabetes mellitus without complications: Secondary | ICD-10-CM | POA: Diagnosis not present

## 2020-01-23 DIAGNOSIS — Z87891 Personal history of nicotine dependence: Secondary | ICD-10-CM | POA: Insufficient documentation

## 2020-01-23 MED ORDER — HYDROCODONE-ACETAMINOPHEN 7.5-325 MG PO TABS: 1.0000 | ORAL_TABLET | Freq: Four times a day (QID) | ORAL | 0 refills | Status: DC | PRN

## 2020-01-23 NOTE — Progress Notes (Signed)
Subjective:    Patient ID: Sean [name], male    DOB: January 17, 1946, 74 y.o.   MRN: [id number]  HPI: Chistian L [name] is a 74 y.o. male who returns for follow up appointment for chronic pain and medication refill. She states her pain is located in his mid- back. He rates his pain 8. His current exercise regime is walking,  performing stretching exercises and light yard work such as gardening he reports.   [name] Morphine equivalent is 28.75   MME.    Last Oral Swab was Performed on 11/20/2019, it was consistent.    Pain Inventory Average Pain 8 Pain Right Now 8 My pain is constant  In the last 24 hours, has pain interfered with the following? General activity 5 Relation with others 5 Enjoyment of life 9 What TIME of day is your pain at its worst? night Sleep (in general) Poor  Pain is worse with: sitting Pain improves with: medication Relief from Meds: 8  Mobility walk without assistance ability to climb steps?  no do you drive?  yes  Function retired  Neuro/Psych spasms  Prior Studies Any changes since last visit?  no  Physicians involved in your care Any changes since last visit?  no   History reviewed. No pertinent family history. Social History   Socioeconomic History  . Marital status: Married    Spouse name: Not on file  . Number of children: Not on file  . Years of education: Not on file  . Highest education level: Not on file  Occupational History  . Not on file  Tobacco Use  . Smoking status: Former Smoker    Types: Cigarettes  . Smokeless tobacco: Never Used  Substance and Sexual Activity  . Alcohol use: Not Currently  . Drug use: Never  . Sexual activity: Not on file  Other Topics Concern  . Not on file  Social History Narrative  . Not on file   Social Determinants of Health   Financial Resource Strain:   . Difficulty of Paying Living Expenses:   Food Insecurity:   . Worried About Charity fundraiser in the Last Year:   .  Arboriculturist in the Last Year:   Transportation Needs:   . Film/video editor (Medical):   Marland Kitchen Lack of Transportation (Non-Medical):   Physical Activity:   . Days of Exercise per Week:   . Minutes of Exercise per Session:   Stress:   . Feeling of Stress :   Social Connections:   . Frequency of Communication with Friends and Family:   . Frequency of Social Gatherings with Friends and Family:   . Attends Religious Services:   . Active Member of Clubs or Organizations:   . Attends Archivist Meetings:   Marland Kitchen Marital Status:    Past Surgical History:  Procedure Laterality Date  . BACK SURGERY    . BRAIN SURGERY    . CHOLECYSTECTOMY    . KNEE ARTHROSCOPY    . NECK SURGERY    . SPINE SURGERY     Spinal Cord Stimulator  . THYROIDECTOMY     Past Medical History:  Diagnosis Date  . Diabetes ([place])   . Hypercholesteremia   . Hypertension   . Kidney stone   . Peptic ulcer disease    There were no vitals taken for this visit.  Opioid Risk Score:   Fall Risk Score:  `1  Depression screen PHQ 2/9  Depression screen  [facility] 2/9 09/20/2019 02/28/2019 01/02/2019  Decreased Interest 0 0 0  Down, Depressed, Hopeless 0 0 0  PHQ - 2 Score 0 0 0    Review of Systems  Constitutional: Negative.   HENT: Negative.   Eyes: Negative.   Respiratory: Negative.   Cardiovascular: Negative.   Gastrointestinal: Negative.   Endocrine: Negative.   Genitourinary: Negative.   Musculoskeletal: Positive for back pain.  Skin: Negative.   Allergic/Immunologic: Negative.   Neurological: Negative.   Psychiatric/Behavioral: Negative.   All other systems reviewed and are negative.      Objective:   Physical Exam Vitals and nursing note reviewed.  Constitutional:      Appearance: Normal appearance.  Cardiovascular:     Rate and Rhythm: Normal rate and regular rhythm.     Pulses: Normal pulses.     Heart sounds: Normal heart sounds.  Pulmonary:     Effort: Pulmonary effort is normal.       Breath sounds: Normal breath sounds.  Musculoskeletal:     Cervical back: Normal range of motion and neck supple.     Comments: Normal Muscle Bulk and Muscle Testing Reveals:  Upper Extremities: Full ROM and Muscle Strength 5/5 Thoracic Paraspinal Tenderness: T-7-T-9  Lower Extremities: Full ROM and Muscle Strength 5/5 Arises from Table with ease Narrow Based Gait   Skin:    General: Skin is warm and dry.  Neurological:     Mental Status: He is alert and oriented to person, place, and time.  Psychiatric:        Mood and Affect: Mood normal.        Behavior: Behavior normal.           Assessment & Plan:  1. Lumbar Post Laminectomy Syndrome:Chronic Bilateral Thoracic Back Pain:Continue HEP as Tolerated. Continue to Monitor.01/23/2020. 2. Left Lumbar Radiculitis:No complaints today.Continue Nortriptyline. Continue to Monitor. 01/23/2020 3. Chronic Pain Syndrome: Refilled: Hydrocodone 7.5/325 mg one tablet every 6 hours as needed for moderate pain #115. Second script sent for the following month.01/23/2020. We will continue the opioid monitoring program, this consists of regular clinic visits, examinations, urine drug screen, pill counts as well as use of [place] Controlled Substance Reporting system.  F/U in 2 months.  15 minutes of face to face patient care time was spent during this visit. All questions were encouraged and answered.

## 2020-03-26 ENCOUNTER — Encounter: Payer: Self-pay | Admitting: Registered Nurse

## 2020-03-26 ENCOUNTER — Encounter: Payer: Medicare Other | Attending: Physical Medicine & Rehabilitation | Admitting: Registered Nurse

## 2020-03-26 ENCOUNTER — Other Ambulatory Visit: Payer: Self-pay

## 2020-03-26 VITALS — BP 117/80 | HR 97 | Temp 97.5°F | Ht 67.0 in | Wt 187.6 lb

## 2020-03-26 DIAGNOSIS — M48061 Spinal stenosis, lumbar region without neurogenic claudication: Secondary | ICD-10-CM | POA: Diagnosis not present

## 2020-03-26 DIAGNOSIS — Z5181 Encounter for therapeutic drug level monitoring: Secondary | ICD-10-CM | POA: Diagnosis not present

## 2020-03-26 DIAGNOSIS — M545 Low back pain, unspecified: Secondary | ICD-10-CM

## 2020-03-26 DIAGNOSIS — M25561 Pain in right knee: Secondary | ICD-10-CM

## 2020-03-26 DIAGNOSIS — E7800 Pure hypercholesterolemia, unspecified: Secondary | ICD-10-CM | POA: Diagnosis not present

## 2020-03-26 DIAGNOSIS — M961 Postlaminectomy syndrome, not elsewhere classified: Secondary | ICD-10-CM | POA: Diagnosis not present

## 2020-03-26 DIAGNOSIS — Z79891 Long term (current) use of opiate analgesic: Secondary | ICD-10-CM | POA: Diagnosis not present

## 2020-03-26 DIAGNOSIS — G894 Chronic pain syndrome: Secondary | ICD-10-CM | POA: Diagnosis not present

## 2020-03-26 DIAGNOSIS — Z87891 Personal history of nicotine dependence: Secondary | ICD-10-CM | POA: Insufficient documentation

## 2020-03-26 DIAGNOSIS — Z981 Arthrodesis status: Secondary | ICD-10-CM | POA: Insufficient documentation

## 2020-03-26 DIAGNOSIS — I10 Essential (primary) hypertension: Secondary | ICD-10-CM | POA: Diagnosis not present

## 2020-03-26 DIAGNOSIS — E119 Type 2 diabetes mellitus without complications: Secondary | ICD-10-CM | POA: Insufficient documentation

## 2020-03-26 DIAGNOSIS — Z87442 Personal history of urinary calculi: Secondary | ICD-10-CM | POA: Diagnosis not present

## 2020-03-26 DIAGNOSIS — Z79899 Other long term (current) drug therapy: Secondary | ICD-10-CM | POA: Diagnosis not present

## 2020-03-26 DIAGNOSIS — G8929 Other chronic pain: Secondary | ICD-10-CM | POA: Diagnosis not present

## 2020-03-26 MED ORDER — HYDROCODONE-ACETAMINOPHEN 7.5-325 MG PO TABS: 1.0000 | ORAL_TABLET | Freq: Four times a day (QID) | ORAL | 0 refills | Status: DC | PRN

## 2020-03-26 NOTE — Progress Notes (Signed)
Subjective:    Patient ID: Sean [name], male    DOB: Feb 22, 1946, 74 y.o.   MRN: [id number]  HPI: Sean [name] is a 74 y.o. male who returns for follow up appointment for chronic pain and medication refill. He states his pain is located in his lower back and right knee pain. He states a week ago he was stepping into his tractor when he twisted his right knee, he denies falling. He reports since the incident he has develop right knee pain and also states it is resolving and denies X-ray at this time. He  Rates his  Pain 9. His  current exercise regime is walking and performing stretching exercises.  Mr. Traquan Morphine equivalent is 29.74  MME.  Oral Swab was Performed today.   Pain Inventory Average Pain 7 Pain Right Now 9 My pain is constant and sharp  In the last 24 hours, has pain interfered with the following? General activity 2 Relation with others 2 Enjoyment of life 3 What TIME of day is your pain at its worst? night Sleep (in general) Poor  Pain is worse with: sitting Pain improves with: medication Relief from Meds: 9  Mobility walk without assistance how many minutes can you walk? 20 ability to climb steps?  yes do you drive?  yes  Function retired  Neuro/Psych spasms  Prior Studies Any changes since last visit?  no  Physicians involved in your care Any changes since last visit?  no   No family history on file. Social History   Socioeconomic History  . Marital status: Married    Spouse name: Not on file  . Number of children: Not on file  . Years of education: Not on file  . Highest education level: Not on file  Occupational History  . Not on file  Tobacco Use  . Smoking status: Former Smoker    Types: Cigarettes  . Smokeless tobacco: Never Used  Substance and Sexual Activity  . Alcohol use: Not Currently  . Drug use: Never  . Sexual activity: Not on file  Other Topics Concern  . Not on file  Social History Narrative  . Not on file     Social Determinants of Health   Financial Resource Strain:   . Difficulty of Paying Living Expenses:   Food Insecurity:   . Worried About Charity fundraiser in the Last Year:   . Arboriculturist in the Last Year:   Transportation Needs:   . Film/video editor (Medical):   Marland Kitchen Lack of Transportation (Non-Medical):   Physical Activity:   . Days of Exercise per Week:   . Minutes of Exercise per Session:   Stress:   . Feeling of Stress :   Social Connections:   . Frequency of Communication with Friends and Family:   . Frequency of Social Gatherings with Friends and Family:   . Attends Religious Services:   . Active Member of Clubs or Organizations:   . Attends Archivist Meetings:   Marland Kitchen Marital Status:    Past Surgical History:  Procedure Laterality Date  . BACK SURGERY    . BRAIN SURGERY    . CHOLECYSTECTOMY    . KNEE ARTHROSCOPY    . NECK SURGERY    . SPINE SURGERY     Spinal Cord Stimulator  . THYROIDECTOMY     Past Medical History:  Diagnosis Date  . Diabetes ([place])   . Hypercholesteremia   . Hypertension   .  Kidney stone   . Peptic ulcer disease    BP 117/80   Pulse 97   Temp (!) 97.5 F (36.4 C)   Ht 5\' 7"  (1.702 m)   Wt 187 lb 9.6 oz (85.1 kg)   SpO2 93%   BMI 29.38 kg/m   Opioid Risk Score:   Fall Risk Score:  `1  Depression screen PHQ 2/9  Depression screen [facility] 2/9 09/20/2019 02/28/2019 01/02/2019  Decreased Interest 0 0 0  Down, Depressed, Hopeless 0 0 0  PHQ - 2 Score 0 0 0    Review of Systems  Musculoskeletal: Positive for back pain.       Spasms  All other systems reviewed and are negative.      Objective:   Physical Exam Vitals and nursing note reviewed.  Constitutional:      Appearance: Normal appearance.  Cardiovascular:     Rate and Rhythm: Normal rate and regular rhythm.     Pulses: Normal pulses.     Heart sounds: Normal heart sounds.  Pulmonary:     Effort: Pulmonary effort is normal.     Breath sounds: Normal  breath sounds.  Musculoskeletal:     Cervical back: Normal range of motion and neck supple.     Comments: Normal Muscle Bulk and Muscle Testing Reveals:  Upper Extremities: Full ROM and Muscle Strength 5/5 Lumbar Hypersensitivity Lower Extremities: Right: Decreased ROM and Muscle Strength 5/5 Left: Full ROM and Muscle Strength 5/5 Arises from chair with ease Narrow Based Gait   Skin:    General: Skin is warm and dry.  Neurological:     Mental Status: He is alert and oriented to person, place, and time.  Psychiatric:        Mood and Affect: Mood normal.        Behavior: Behavior normal.           Assessment & Plan:  1. Lumbar Post Laminectomy Syndrome:Chronic Bilateral Lower  Back Pain:Continue HEP as Tolerated. Continue to Monitor.03/26/2020. 2. Left Lumbar Radiculitis:No complaints today.Continue Nortriptyline. Continue to Monitor.03/26/2020 3. Chronic Pain Syndrome: Refilled: Hydrocodone 7.5/325 mg one tablet every 6 hours as needed for moderate pain #115. Second script sent for the following month.03/26/2020. We will continue the opioid monitoring program, this consists of regular clinic visits, examinations, urine drug screen, pill counts as well as use of [place] Controlled Substance Reporting system. 4. Right Knee Pain: Refuses X-Ray at this time. Continue to Monitor.   F/U in 2 months.  64minutes of face to face patient care time was spent during this visit. All questions were encouraged and answered.

## 2020-03-29 LAB — DRUG TOX MONITOR 1 W/CONF, ORAL FLD
Amphetamines: NEGATIVE ng/mL (ref <10)
Barbiturates: NEGATIVE ng/mL (ref <10)
Benzodiazepines: NEGATIVE ng/mL (ref <0.50)
Buprenorphine: NEGATIVE ng/mL (ref <0.10)
Cocaine: NEGATIVE ng/mL (ref <5.0)
Codeine: NEGATIVE ng/mL (ref <2.5)
Dihydrocodeine: 10.2 ng/mL — ABNORMAL HIGH (ref <2.5)
Fentanyl: NEGATIVE ng/mL (ref <0.10)
Heroin Metabolite: NEGATIVE ng/mL (ref <1.0)
Hydrocodone: 54.7 ng/mL — ABNORMAL HIGH (ref <2.5)
Hydromorphone: NEGATIVE ng/mL (ref <2.5)
MARIJUANA: NEGATIVE ng/mL (ref <2.5)
MDMA: NEGATIVE ng/mL (ref <10)
Meprobamate: NEGATIVE ng/mL (ref <2.5)
Methadone: NEGATIVE ng/mL (ref <5.0)
Morphine: NEGATIVE ng/mL (ref <2.5)
Nicotine Metabolite: NEGATIVE ng/mL (ref <5.0)
Norhydrocodone: NEGATIVE ng/mL (ref <2.5)
Noroxycodone: NEGATIVE ng/mL (ref <2.5)
Opiates: POSITIVE ng/mL — AB (ref <2.5)
Oxycodone: NEGATIVE ng/mL (ref <2.5)
Oxymorphone: NEGATIVE ng/mL (ref <2.5)
Phencyclidine: NEGATIVE ng/mL (ref <10)
Tapentadol: NEGATIVE ng/mL (ref <5.0)
Tramadol: NEGATIVE ng/mL (ref <5.0)
Zolpidem: NEGATIVE ng/mL (ref <5.0)

## 2020-03-29 LAB — DRUG TOX ALC METAB W/CON, ORAL FLD: Alcohol Metabolite: NEGATIVE ng/mL (ref <25)

## 2020-04-05 ENCOUNTER — Telehealth: Payer: Self-pay | Admitting: *Deleted

## 2020-04-05 NOTE — Telephone Encounter (Signed)
Oral swab drug screen was consistent for prescribed medications.  ?

## 2020-04-09 DIAGNOSIS — M546 Pain in thoracic spine: Secondary | ICD-10-CM | POA: Diagnosis not present

## 2020-04-11 DIAGNOSIS — I2510 Atherosclerotic heart disease of native coronary artery without angina pectoris: Secondary | ICD-10-CM | POA: Diagnosis not present

## 2020-04-11 DIAGNOSIS — M5134 Other intervertebral disc degeneration, thoracic region: Secondary | ICD-10-CM | POA: Diagnosis not present

## 2020-04-11 DIAGNOSIS — I700 Atherosclerosis of aorta: Secondary | ICD-10-CM | POA: Diagnosis not present

## 2020-04-11 DIAGNOSIS — M546 Pain in thoracic spine: Secondary | ICD-10-CM | POA: Diagnosis not present

## 2020-04-11 DIAGNOSIS — M40204 Unspecified kyphosis, thoracic region: Secondary | ICD-10-CM | POA: Diagnosis not present

## 2020-04-18 DIAGNOSIS — E063 Autoimmune thyroiditis: Secondary | ICD-10-CM | POA: Diagnosis not present

## 2020-04-18 DIAGNOSIS — E785 Hyperlipidemia, unspecified: Secondary | ICD-10-CM | POA: Diagnosis not present

## 2020-04-18 DIAGNOSIS — E118 Type 2 diabetes mellitus with unspecified complications: Secondary | ICD-10-CM | POA: Diagnosis not present

## 2020-04-25 DIAGNOSIS — M546 Pain in thoracic spine: Secondary | ICD-10-CM | POA: Diagnosis not present

## 2020-05-21 ENCOUNTER — Other Ambulatory Visit: Payer: Self-pay

## 2020-05-21 ENCOUNTER — Encounter: Payer: Medicare Other | Attending: Physical Medicine & Rehabilitation | Admitting: Registered Nurse

## 2020-05-21 VITALS — BP 120/77 | HR 85 | Temp 98.4°F | Ht 67.0 in | Wt 185.8 lb

## 2020-05-21 DIAGNOSIS — M546 Pain in thoracic spine: Secondary | ICD-10-CM | POA: Diagnosis not present

## 2020-05-21 DIAGNOSIS — Z79899 Other long term (current) drug therapy: Secondary | ICD-10-CM | POA: Diagnosis not present

## 2020-05-21 DIAGNOSIS — Z981 Arthrodesis status: Secondary | ICD-10-CM | POA: Diagnosis not present

## 2020-05-21 DIAGNOSIS — I10 Essential (primary) hypertension: Secondary | ICD-10-CM | POA: Insufficient documentation

## 2020-05-21 DIAGNOSIS — Z87442 Personal history of urinary calculi: Secondary | ICD-10-CM | POA: Diagnosis not present

## 2020-05-21 DIAGNOSIS — Z5181 Encounter for therapeutic drug level monitoring: Secondary | ICD-10-CM | POA: Diagnosis not present

## 2020-05-21 DIAGNOSIS — M48061 Spinal stenosis, lumbar region without neurogenic claudication: Secondary | ICD-10-CM | POA: Diagnosis not present

## 2020-05-21 DIAGNOSIS — E119 Type 2 diabetes mellitus without complications: Secondary | ICD-10-CM | POA: Insufficient documentation

## 2020-05-21 DIAGNOSIS — Z79891 Long term (current) use of opiate analgesic: Secondary | ICD-10-CM | POA: Insufficient documentation

## 2020-05-21 DIAGNOSIS — M545 Low back pain: Secondary | ICD-10-CM | POA: Insufficient documentation

## 2020-05-21 DIAGNOSIS — Z87891 Personal history of nicotine dependence: Secondary | ICD-10-CM | POA: Diagnosis not present

## 2020-05-21 DIAGNOSIS — M961 Postlaminectomy syndrome, not elsewhere classified: Secondary | ICD-10-CM | POA: Diagnosis not present

## 2020-05-21 DIAGNOSIS — E7800 Pure hypercholesterolemia, unspecified: Secondary | ICD-10-CM | POA: Insufficient documentation

## 2020-05-21 DIAGNOSIS — G8929 Other chronic pain: Secondary | ICD-10-CM | POA: Diagnosis not present

## 2020-05-21 DIAGNOSIS — G894 Chronic pain syndrome: Secondary | ICD-10-CM

## 2020-05-21 DIAGNOSIS — M25561 Pain in right knee: Secondary | ICD-10-CM

## 2020-05-21 MED ORDER — HYDROCODONE-ACETAMINOPHEN 7.5-325 MG PO TABS: 1.0000 | ORAL_TABLET | Freq: Four times a day (QID) | ORAL | 0 refills | Status: DC | PRN

## 2020-05-21 NOTE — Progress Notes (Signed)
Subjective:    Patient ID: Sean [name], male    DOB: 1946-03-19, 74 y.o.   MRN: [id number]  HPI: Sean [name] is a 74 y.o. male who returns for follow up appointment for chronic pain and medication refill. He states his pain is located in his mid- back and right knee. He rates his pain 5. His current exercise regime is walking and performing stretching exercises.  Also reports a week ago he was getting off his tractor , he lost his footing and landed on his right knee, he was able to pick himself up. He's wearing right knee sleeve and states he will follow up with his orthopedist.  [name] Morphine equivalent is 29.74 MME.    Last Oral Swab was Performed on 03/26/2020, it was consistent.  Pain Inventory Average Pain 5 Pain Right Now 5 My pain is aching  In the last 24 hours, has pain interfered with the following? General activity 5 Relation with others 5 Enjoyment of life 5 What TIME of day is your pain at its worst? night Sleep (in general) Poor  Pain is worse with: sitting Pain improves with: heat/ice, medication and TENS Relief from Meds: 8  No family history on file. Social History   Socioeconomic History  . Marital status: Married    Spouse name: Not on file  . Number of children: Not on file  . Years of education: Not on file  . Highest education level: Not on file  Occupational History  . Not on file  Tobacco Use  . Smoking status: Former Smoker    Types: Cigarettes  . Smokeless tobacco: Never Used  Substance and Sexual Activity  . Alcohol use: Not Currently  . Drug use: Never  . Sexual activity: Not on file  Other Topics Concern  . Not on file  Social History Narrative  . Not on file   Social Determinants of Health   Financial Resource Strain:   . Difficulty of Paying Living Expenses: Not on file  Food Insecurity:   . Worried About Charity fundraiser in the Last Year: Not on file  . Ran Out of Food in the Last Year: Not on file    Transportation Needs:   . Lack of Transportation (Medical): Not on file  . Lack of Transportation (Non-Medical): Not on file  Physical Activity:   . Days of Exercise per Week: Not on file  . Minutes of Exercise per Session: Not on file  Stress:   . Feeling of Stress : Not on file  Social Connections:   . Frequency of Communication with Friends and Family: Not on file  . Frequency of Social Gatherings with Friends and Family: Not on file  . Attends Religious Services: Not on file  . Active Member of Clubs or Organizations: Not on file  . Attends Archivist Meetings: Not on file  . Marital Status: Not on file   Past Surgical History:  Procedure Laterality Date  . BACK SURGERY    . BRAIN SURGERY    . CHOLECYSTECTOMY    . KNEE ARTHROSCOPY    . NECK SURGERY    . SPINE SURGERY     Spinal Cord Stimulator  . THYROIDECTOMY     Past Surgical History:  Procedure Laterality Date  . BACK SURGERY    . BRAIN SURGERY    . CHOLECYSTECTOMY    . KNEE ARTHROSCOPY    . NECK SURGERY    . SPINE SURGERY  Spinal Cord Stimulator  . THYROIDECTOMY     Past Medical History:  Diagnosis Date  . Diabetes ([place])   . Hypercholesteremia   . Hypertension   . Kidney stone   . Peptic ulcer disease    BP 120/77   Pulse 85   Temp 98.4 F (36.9 C)   Ht 5\' 7"  (1.702 m)   Wt 185 lb 12.8 oz (84.3 kg)   SpO2 96%   BMI 29.10 kg/m   Opioid Risk Score:   Fall Risk Score:  `1  Depression screen PHQ 2/9  Depression screen [facility] 2/9 03/26/2020 09/20/2019 02/28/2019 01/02/2019  Decreased Interest 0 0 0 0  Down, Depressed, Hopeless 0 0 0 0  PHQ - 2 Score 0 0 0 0    Review of Systems  Musculoskeletal: Positive for back pain.  All other systems reviewed and are negative.      Objective:   Physical Exam Vitals and nursing note reviewed.  Constitutional:      Appearance: Normal appearance.  Cardiovascular:     Rate and Rhythm: Normal rate and regular rhythm.     Pulses: Normal pulses.      Heart sounds: Normal heart sounds.  Pulmonary:     Effort: Pulmonary effort is normal.     Breath sounds: Normal breath sounds.  Musculoskeletal:     Cervical back: Normal range of motion and neck supple.     Comments: Normal Muscle Bulk and Muscle Testing Reveals:  Upper Extremities: Full ROM and Muscle Strength 5/5 Thoracic Paraspinal Tenderness: T-7-T-9  Lower Extremities: Full ROM and Muscle Strength 5/5 Arises from chair with ease Narrow Based  Gait   Skin:    General: Skin is warm and dry.  Neurological:     Mental Status: He is alert and oriented to person, place, and time.  Psychiatric:        Mood and Affect: Mood normal.        Behavior: Behavior normal.           Assessment & Plan:  1. Lumbar Post Laminectomy Syndrome:Chronic Bilateral Lower  Back Pain:Continue HEP as Tolerated. Continue to Monitor.05/21/2020. 2. Left Lumbar Radiculitis:No complaints today.Continue Nortriptyline. Continue to Monitor.05/21/2020 3. Chronic Pain Syndrome: Refilled: Hydrocodone 7.5/325 mg one tablet every 6 hours as needed for moderate pain #115. Second script sent for the following month.03/26/2020. We will continue the opioid monitoring program, this consists of regular clinic visits, examinations, urine drug screen, pill counts as well as use of [place] Controlled Substance Reporting system. A 12 month History has been reviewed on the [place] on 05/21/2020.  4. Right Knee Pain: Refuses X-Ray at this time. Continue to Monitor.   F/U in 2 months.  50minutes of face to face patient care time was spent during this visit. All questions were encouraged and answered.

## 2020-05-23 DIAGNOSIS — M1711 Unilateral primary osteoarthritis, right knee: Secondary | ICD-10-CM | POA: Diagnosis not present

## 2020-05-25 ENCOUNTER — Encounter: Payer: Self-pay | Admitting: Registered Nurse

## 2020-06-26 DIAGNOSIS — K591 Functional diarrhea: Secondary | ICD-10-CM | POA: Diagnosis not present

## 2020-07-09 DIAGNOSIS — L570 Actinic keratosis: Secondary | ICD-10-CM | POA: Diagnosis not present

## 2020-07-23 ENCOUNTER — Encounter: Payer: Self-pay | Admitting: Registered Nurse

## 2020-07-23 ENCOUNTER — Encounter: Payer: Medicare Other | Attending: Physical Medicine & Rehabilitation | Admitting: Registered Nurse

## 2020-07-23 ENCOUNTER — Other Ambulatory Visit: Payer: Self-pay

## 2020-07-23 VITALS — BP 124/78 | HR 80 | Temp 98.4°F | Ht 67.0 in | Wt 188.0 lb

## 2020-07-23 DIAGNOSIS — G8929 Other chronic pain: Secondary | ICD-10-CM | POA: Diagnosis not present

## 2020-07-23 DIAGNOSIS — G894 Chronic pain syndrome: Secondary | ICD-10-CM | POA: Diagnosis not present

## 2020-07-23 DIAGNOSIS — M961 Postlaminectomy syndrome, not elsewhere classified: Secondary | ICD-10-CM

## 2020-07-23 DIAGNOSIS — Z79891 Long term (current) use of opiate analgesic: Secondary | ICD-10-CM | POA: Diagnosis not present

## 2020-07-23 DIAGNOSIS — M546 Pain in thoracic spine: Secondary | ICD-10-CM | POA: Diagnosis not present

## 2020-07-23 DIAGNOSIS — Z5181 Encounter for therapeutic drug level monitoring: Secondary | ICD-10-CM | POA: Insufficient documentation

## 2020-07-23 MED ORDER — HYDROCODONE-ACETAMINOPHEN 7.5-325 MG PO TABS: 1.0000 | ORAL_TABLET | Freq: Four times a day (QID) | ORAL | 0 refills | Status: DC | PRN

## 2020-07-23 NOTE — Progress Notes (Signed)
Subjective:    Patient ID: Sean [name], male    DOB: [birth year], 74 y.o.   MRN: [id number]  HPI: Sean [name] is a 74 y.o. male who returns for follow up appointment for chronic pain and medication refill. He states his pain is located in his mid- back and occasionally lower back pain. He rates his pain 7. His current exercise regime is walking and performing stretching exercises.  [name] Morphine equivalent is 30.00 MME.    Last Oral Swab was Performed on 03/26/2020, it was consistent.   Pain Inventory Average Pain 7 Pain Right Now 7 My pain is sharp  In the last 24 hours, has pain interfered with the following? General activity 4 Relation with others 6 Enjoyment of life 5 What TIME of day is your pain at its worst? night Sleep (in general) Poor  Pain is worse with: bending and standing Pain improves with: heat/ice and medication Relief from Meds: 5  No family history on file. Social History   Socioeconomic History  . Marital status: Married    Spouse name: Not on file  . Number of children: Not on file  . Years of education: Not on file  . Highest education level: Not on file  Occupational History  . Not on file  Tobacco Use  . Smoking status: Former Smoker    Types: Cigarettes  . Smokeless tobacco: Never Used  Substance and Sexual Activity  . Alcohol use: Not Currently  . Drug use: Never  . Sexual activity: Not on file  Other Topics Concern  . Not on file  Social History Narrative  . Not on file   Social Determinants of Health   Financial Resource Strain:   . Difficulty of Paying Living Expenses: Not on file  Food Insecurity:   . Worried About Charity fundraiser in the Last Year: Not on file  . Ran Out of Food in the Last Year: Not on file  Transportation Needs:   . Lack of Transportation (Medical): Not on file  . Lack of Transportation (Non-Medical): Not on file  Physical Activity:   . Days of Exercise per Week: Not on file  . Minutes  of Exercise per Session: Not on file  Stress:   . Feeling of Stress : Not on file  Social Connections:   . Frequency of Communication with Friends and Family: Not on file  . Frequency of Social Gatherings with Friends and Family: Not on file  . Attends Religious Services: Not on file  . Active Member of Clubs or Organizations: Not on file  . Attends Archivist Meetings: Not on file  . Marital Status: Not on file   Past Surgical History:  Procedure Laterality Date  . BACK SURGERY    . BRAIN SURGERY    . CHOLECYSTECTOMY    . KNEE ARTHROSCOPY    . NECK SURGERY    . SPINE SURGERY     Spinal Cord Stimulator  . THYROIDECTOMY     Past Surgical History:  Procedure Laterality Date  . BACK SURGERY    . BRAIN SURGERY    . CHOLECYSTECTOMY    . KNEE ARTHROSCOPY    . NECK SURGERY    . SPINE SURGERY     Spinal Cord Stimulator  . THYROIDECTOMY     Past Medical History:  Diagnosis Date  . Diabetes ([place])   . Hypercholesteremia   . Hypertension   . Kidney stone   . Peptic ulcer  disease    BP 124/78   Pulse 80   Temp 98.4 F (36.9 C)   Ht 5\' 7"  (1.702 m)   Wt 188 lb (85.3 kg)   SpO2 96%   BMI 29.44 kg/m   Opioid Risk Score:   Fall Risk Score:  `1  Depression screen PHQ 2/9  Depression screen [facility] 2/9 03/26/2020 09/20/2019 02/28/2019 01/02/2019  Decreased Interest 0 0 0 0  Down, Depressed, Hopeless 0 0 0 0  PHQ - 2 Score 0 0 0 0    Review of Systems  Musculoskeletal: Positive for back pain.  All other systems reviewed and are negative.      Objective:   Physical Exam Vitals and nursing note reviewed.  Constitutional:      Appearance: Normal appearance.  Cardiovascular:     Rate and Rhythm: Normal rate and regular rhythm.     Pulses: Normal pulses.     Heart sounds: Normal heart sounds.  Pulmonary:     Effort: Pulmonary effort is normal.     Breath sounds: Normal breath sounds.  Musculoskeletal:     Cervical back: Normal range of motion and neck supple.       Comments: Normal Muscle Bulk and Muscle Testing Reveals:  Upper Extremities: Full ROM and Muscle Strength 5/5 Thoracic Paraspinal Tenderness: T-7-T-9  Lower Extremities: Full ROM and Muscle Strength 5/5 Arises from Table with ease Narrow Based  Gait   Skin:    General: Skin is warm and dry.  Neurological:     Mental Status: He is alert and oriented to person, place, and time.  Psychiatric:        Mood and Affect: Mood normal.        Behavior: Behavior normal.           Assessment & Plan:  1. Lumbar Post Laminectomy Syndrome:Chronic BilateralLowerBack Pain:Continue HEP as Tolerated. Continue to Monitor.07/23/2020. 2. Left Lumbar Radiculitis:No complaints today.Continue Nortriptyline. Continue to Monitor.07/23/2020 3. Chronic Pain Syndrome: Refilled: Hydrocodone 7.5/325 mg one tablet every 6 hours as needed for moderate pain #115. Second script sent for the following month.07/23/2020. We will continue the opioid monitoring program, this consists of regular clinic visits, examinations, urine drug screen, pill counts as well as use of [place] Controlled Substance Reporting system. A 12 month History has been reviewed on the [place] Controlled Substance Reporting System on 07/23/2020.    F/U in 2 months.

## 2020-08-01 DIAGNOSIS — E785 Hyperlipidemia, unspecified: Secondary | ICD-10-CM | POA: Diagnosis not present

## 2020-08-01 DIAGNOSIS — Z23 Encounter for immunization: Secondary | ICD-10-CM | POA: Diagnosis not present

## 2020-08-01 DIAGNOSIS — E063 Autoimmune thyroiditis: Secondary | ICD-10-CM | POA: Diagnosis not present

## 2020-08-01 DIAGNOSIS — I10 Essential (primary) hypertension: Secondary | ICD-10-CM | POA: Diagnosis not present

## 2020-08-01 DIAGNOSIS — E118 Type 2 diabetes mellitus with unspecified complications: Secondary | ICD-10-CM | POA: Diagnosis not present

## 2020-08-01 DIAGNOSIS — K219 Gastro-esophageal reflux disease without esophagitis: Secondary | ICD-10-CM | POA: Diagnosis not present

## 2020-08-02 DIAGNOSIS — Z1211 Encounter for screening for malignant neoplasm of colon: Secondary | ICD-10-CM | POA: Diagnosis not present

## 2020-08-02 DIAGNOSIS — K644 Residual hemorrhoidal skin tags: Secondary | ICD-10-CM | POA: Diagnosis not present

## 2020-08-02 DIAGNOSIS — E119 Type 2 diabetes mellitus without complications: Secondary | ICD-10-CM | POA: Diagnosis not present

## 2020-08-02 DIAGNOSIS — Z86010 Personal history of colonic polyps: Secondary | ICD-10-CM | POA: Diagnosis not present

## 2020-08-02 DIAGNOSIS — Z09 Encounter for follow-up examination after completed treatment for conditions other than malignant neoplasm: Secondary | ICD-10-CM | POA: Diagnosis not present

## 2020-08-02 DIAGNOSIS — I10 Essential (primary) hypertension: Secondary | ICD-10-CM | POA: Diagnosis not present

## 2020-08-02 DIAGNOSIS — Z800 Family history of malignant neoplasm of digestive organs: Secondary | ICD-10-CM | POA: Diagnosis not present

## 2020-08-02 DIAGNOSIS — K648 Other hemorrhoids: Secondary | ICD-10-CM | POA: Diagnosis not present

## 2020-08-20 ENCOUNTER — Telehealth: Payer: Self-pay

## 2020-08-20 NOTE — Telephone Encounter (Signed)
Pharmacist called: Will you change the date of Sean [name] Hydrocodone release on 08/22/2020. To tomorrow 08/21/2020 because they will be closed on 08/22/2020.  Call back phone [phone].

## 2020-08-20 NOTE — Telephone Encounter (Signed)
Placed a call to [place], spoke with pharmacist, he was given permission to fill Sean [name] Hydrocodone on 08/21/2020.

## 2020-09-16 ENCOUNTER — Encounter: Payer: Medicare Other | Attending: Physical Medicine & Rehabilitation | Admitting: Registered Nurse

## 2020-09-16 ENCOUNTER — Encounter: Payer: Self-pay | Admitting: Registered Nurse

## 2020-09-16 ENCOUNTER — Other Ambulatory Visit: Payer: Self-pay

## 2020-09-16 VITALS — Ht 67.0 in | Wt 185.0 lb

## 2020-09-16 DIAGNOSIS — Z79891 Long term (current) use of opiate analgesic: Secondary | ICD-10-CM | POA: Insufficient documentation

## 2020-09-16 DIAGNOSIS — G894 Chronic pain syndrome: Secondary | ICD-10-CM | POA: Insufficient documentation

## 2020-09-16 DIAGNOSIS — M961 Postlaminectomy syndrome, not elsewhere classified: Secondary | ICD-10-CM | POA: Insufficient documentation

## 2020-09-16 DIAGNOSIS — M5416 Radiculopathy, lumbar region: Secondary | ICD-10-CM | POA: Insufficient documentation

## 2020-09-16 DIAGNOSIS — M546 Pain in thoracic spine: Secondary | ICD-10-CM | POA: Insufficient documentation

## 2020-09-16 DIAGNOSIS — Z5181 Encounter for therapeutic drug level monitoring: Secondary | ICD-10-CM | POA: Insufficient documentation

## 2020-09-16 DIAGNOSIS — G8929 Other chronic pain: Secondary | ICD-10-CM | POA: Insufficient documentation

## 2020-09-16 MED ORDER — HYDROCODONE-ACETAMINOPHEN 7.5-325 MG PO TABS: 1.0000 | ORAL_TABLET | Freq: Four times a day (QID) | ORAL | 0 refills | Status: DC | PRN

## 2020-09-16 NOTE — Progress Notes (Signed)
Subjective:    Patient ID: Sean [name], male    DOB: 07/21/1946, 74 y.o.   MRN: [id number]  HPI: Sean [name] is a 74 y.o. male whose appointment was changed to a virtual My-Chart office visit to reduce the risk of exposure to the COVID-19 virus and to help Mr. Therien  remain healthy and safe. The virtual My-Chart office visit will also provide continuity of care. Mr. Robar agrees with My-Chart visit and verbalizes understanding. He states his pain is located in his mid- lower back radiating into his left lower extremity. He rates his pain 5. His current exercise regime is walking and performing stretching exercises.  Mr. Apo Morphine equivalent is 29.74 MME.  Oral Swab was Performed on 03/26/2020   Pain Inventory Average Pain 7 Pain Right Now 5 My pain is aching  In the last 24 hours, has pain interfered with the following? General activity 0 Relation with others 0 Enjoyment of life 0 What TIME of day is your pain at its worst? night Sleep (in general) Poor  Pain is worse with: sitting and laying down Pain improves with: heat/ice and medication Relief from Meds: 5  No family history on file. Social History   Socioeconomic History  . Marital status: Married    Spouse name: Not on file  . Number of children: Not on file  . Years of education: Not on file  . Highest education level: Not on file  Occupational History  . Not on file  Tobacco Use  . Smoking status: Former Smoker    Types: Cigarettes  . Smokeless tobacco: Never Used  Substance and Sexual Activity  . Alcohol use: Not Currently  . Drug use: Never  . Sexual activity: Not on file  Other Topics Concern  . Not on file  Social History Narrative  . Not on file   Social Determinants of Health   Financial Resource Strain: Not on file  Food Insecurity: Not on file  Transportation Needs: Not on file  Physical Activity: Not on file  Stress: Not on file  Social Connections: Not on file   Past  Surgical History:  Procedure Laterality Date  . BACK SURGERY    . BRAIN SURGERY    . CHOLECYSTECTOMY    . KNEE ARTHROSCOPY    . NECK SURGERY    . SPINE SURGERY     Spinal Cord Stimulator  . THYROIDECTOMY     Past Surgical History:  Procedure Laterality Date  . BACK SURGERY    . BRAIN SURGERY    . CHOLECYSTECTOMY    . KNEE ARTHROSCOPY    . NECK SURGERY    . SPINE SURGERY     Spinal Cord Stimulator  . THYROIDECTOMY     Past Medical History:  Diagnosis Date  . Diabetes ([place])   . Hypercholesteremia   . Hypertension   . Kidney stone   . Peptic ulcer disease    Ht 5\' 7"  (1.702 m)   Wt 185 lb (83.9 kg)   BMI 28.98 kg/m   Opioid Risk Score:   Fall Risk Score:  `1  Depression screen PHQ 2/9  Depression screen [facility] 2/9 03/26/2020 09/20/2019 02/28/2019 01/02/2019  Decreased Interest 0 0 0 0  Down, Depressed, Hopeless 0 0 0 0  PHQ - 2 Score 0 0 0 0     Review of Systems  Musculoskeletal: Positive for back pain.       Leg pain  All other systems reviewed and are  negative.      Objective:   Physical Exam Vitals and nursing note reviewed.  Musculoskeletal:     Comments: No Physical Exam Performed : My-Chart Video Visit           Assessment & Plan:  1. Lumbar Post Laminectomy Syndrome:Chronic BilateralLowerBack Pain:Continue HEP as Tolerated. Continue to Monitor.09/16/2020. 2. Left Lumbar Radiculitis:No complaints today.Continue Nortriptyline. Continue to Monitor.09/16/2020 3. Chronic Pain Syndrome: Refilled: Hydrocodone 7.5/325 mg one tablet every 6 hours as needed for moderate pain #115. Second script sent for the following month.09/16/2020. We will continue the opioid monitoring program, this consists of regular clinic visits, examinations, urine drug screen, pill counts as well as use of [place] Controlled Substance Reporting system. A 12 month History has been reviewed on the [place] Controlled Substance Reporting Systemon  09/16/2020.   F/U in 2 months.  My-Chart Video Visit Established Patient Location of Patient In his Home  Location of Provider: In the Office

## 2020-11-04 DIAGNOSIS — E785 Hyperlipidemia, unspecified: Secondary | ICD-10-CM | POA: Diagnosis not present

## 2020-11-04 DIAGNOSIS — E063 Autoimmune thyroiditis: Secondary | ICD-10-CM | POA: Diagnosis not present

## 2020-11-04 DIAGNOSIS — E118 Type 2 diabetes mellitus with unspecified complications: Secondary | ICD-10-CM | POA: Diagnosis not present

## 2020-11-04 DIAGNOSIS — K219 Gastro-esophageal reflux disease without esophagitis: Secondary | ICD-10-CM | POA: Diagnosis not present

## 2020-11-04 DIAGNOSIS — J301 Allergic rhinitis due to pollen: Secondary | ICD-10-CM | POA: Diagnosis not present

## 2020-11-04 DIAGNOSIS — I10 Essential (primary) hypertension: Secondary | ICD-10-CM | POA: Diagnosis not present

## 2020-11-15 ENCOUNTER — Other Ambulatory Visit: Payer: Self-pay

## 2020-11-15 ENCOUNTER — Encounter: Payer: Self-pay | Admitting: Registered Nurse

## 2020-11-15 ENCOUNTER — Encounter: Payer: Medicare Other | Attending: Physical Medicine & Rehabilitation | Admitting: Registered Nurse

## 2020-11-15 VITALS — BP 151/83 | HR 102 | Temp 98.2°F | Ht 67.0 in | Wt 193.6 lb

## 2020-11-15 DIAGNOSIS — Z5181 Encounter for therapeutic drug level monitoring: Secondary | ICD-10-CM | POA: Insufficient documentation

## 2020-11-15 DIAGNOSIS — Z79891 Long term (current) use of opiate analgesic: Secondary | ICD-10-CM | POA: Diagnosis not present

## 2020-11-15 DIAGNOSIS — G894 Chronic pain syndrome: Secondary | ICD-10-CM | POA: Insufficient documentation

## 2020-11-15 DIAGNOSIS — M545 Low back pain, unspecified: Secondary | ICD-10-CM | POA: Diagnosis not present

## 2020-11-15 DIAGNOSIS — M961 Postlaminectomy syndrome, not elsewhere classified: Secondary | ICD-10-CM | POA: Diagnosis not present

## 2020-11-15 DIAGNOSIS — G8929 Other chronic pain: Secondary | ICD-10-CM | POA: Insufficient documentation

## 2020-11-15 DIAGNOSIS — M546 Pain in thoracic spine: Secondary | ICD-10-CM | POA: Insufficient documentation

## 2020-11-15 MED ORDER — METHYLPREDNISOLONE 4 MG PO TBPK: ORAL_TABLET | ORAL | 0 refills | Status: DC

## 2020-11-15 MED ORDER — HYDROCODONE-ACETAMINOPHEN 7.5-325 MG PO TABS: 1.0000 | ORAL_TABLET | Freq: Four times a day (QID) | ORAL | 0 refills | Status: DC | PRN

## 2020-11-15 NOTE — Progress Notes (Signed)
Subjective:    Patient ID: Sean [name], male    DOB: 1946/05/09, 75 y.o.   MRN: [id number]  HPI: Sean [name] is a 75 y.o. male who returns for follow up appointment for chronic pain and medication refill. He states his  pain is located in his mid- lower back pain. Also reports increase intensity of lower back pain, with no relief. We will prescribe a Medrol dos pak and his hydrocodone tablets will be increased to 120 tablets, he verbalizes understanding. He rates his pain 10. His  current exercise regime is walking and performing stretching exercises.  Sean [name] Morphine equivalent is 29.74 MME.  Oral Swab was Performed Today.   Pain Inventory Average Pain 9 Pain Right Now 10 My pain is constant, dull and aching  In the last 24 hours, has pain interfered with the following? General activity 5 Relation with others 5 Enjoyment of life 5 What TIME of day is your pain at its worst? night Sleep (in general) Poor  Pain is worse with: walking and sitting Pain improves with: rest, heat/ice and medication Relief from Meds: 7  No family history on file. Social History   Socioeconomic History  . Marital status: Married    Spouse name: Not on file  . Number of children: Not on file  . Years of education: Not on file  . Highest education level: Not on file  Occupational History  . Not on file  Tobacco Use  . Smoking status: Former Smoker    Types: Cigarettes  . Smokeless tobacco: Never Used  Substance and Sexual Activity  . Alcohol use: Not Currently  . Drug use: Never  . Sexual activity: Not on file  Other Topics Concern  . Not on file  Social History Narrative  . Not on file   Social Determinants of Health   Financial Resource Strain: Not on file  Food Insecurity: Not on file  Transportation Needs: Not on file  Physical Activity: Not on file  Stress: Not on file  Social Connections: Not on file   Past Surgical History:  Procedure Laterality Date  . BACK  SURGERY    . BRAIN SURGERY    . CHOLECYSTECTOMY    . KNEE ARTHROSCOPY    . NECK SURGERY    . SPINE SURGERY     Spinal Cord Stimulator  . THYROIDECTOMY     Past Surgical History:  Procedure Laterality Date  . BACK SURGERY    . BRAIN SURGERY    . CHOLECYSTECTOMY    . KNEE ARTHROSCOPY    . NECK SURGERY    . SPINE SURGERY     Spinal Cord Stimulator  . THYROIDECTOMY     Past Medical History:  Diagnosis Date  . Diabetes ([place])   . Hypercholesteremia   . Hypertension   . Kidney stone   . Peptic ulcer disease    BP (!) 151/83   Pulse (!) 102   Temp 98.2 F (36.8 C)   Ht 5\' 7"  (1.702 m)   Wt 193 lb 9.6 oz (87.8 kg)   SpO2 95%   BMI 30.32 kg/m   Opioid Risk Score:   Fall Risk Score:  `1  Depression screen PHQ 2/9  Depression screen [facility] 2/9 11/15/2020 03/26/2020 09/20/2019 02/28/2019 01/02/2019  Decreased Interest 0 0 0 0 0  Down, Depressed, Hopeless 0 0 0 0 0  PHQ - 2 Score 0 0 0 0 0    Review of Systems  Constitutional: Negative.  HENT: Negative.   Eyes: Negative.   Respiratory: Negative.   Cardiovascular: Negative.   Gastrointestinal: Negative.   Endocrine: Negative.   Genitourinary: Negative.   Musculoskeletal: Positive for back pain.       Leg pain  Skin: Negative.   Allergic/Immunologic: Negative.   Neurological: Negative.   Hematological: Negative.   Psychiatric/Behavioral: Negative.   All other systems reviewed and are negative.      Objective:   Physical Exam Vitals and nursing note reviewed.  Constitutional:      Appearance: Normal appearance.  Cardiovascular:     Rate and Rhythm: Normal rate and regular rhythm.     Pulses: Normal pulses.     Heart sounds: Normal heart sounds.  Pulmonary:     Effort: Pulmonary effort is normal.     Breath sounds: Normal breath sounds.  Musculoskeletal:     Cervical back: Normal range of motion and neck supple.     Comments: Normal Muscle Bulk and Muscle Testing Reveals:  Upper Extremities: Full ROM and  Muscle Strength 5/5 Thoracic Paraspinal Tenderness: T-7-T-9 Lumbar Hypersensitivity Lower Extremities: Full ROM and Muscle Strength 5/5 Arises from Table with ease Narrow Based  Gait   Skin:    General: Skin is warm and dry.  Neurological:     Mental Status: He is alert and oriented to person, place, and time.  Psychiatric:        Mood and Affect: Mood normal.        Behavior: Behavior normal.           Assessment & Plan:  1. Acute Exacerbation of Chronic Low Back Pain: RX: Medrol Dose Pak. Continue to Monitor.  2. Lumbar Post Laminectomy Syndrome:Chronic BilateralLowerBack Pain:Continue HEP as Tolerated. Continue to Monitor.11/15/2020. 3. Left Lumbar Radiculitis:No complaints today.Continue Nortriptyline. Continue to Monitor.11/15/2020 4. Chronic Pain Syndrome: Increased: Refilled: Hydrocodone 7.5/325 mg one tablet every 6 hours as needed for moderate pain #120. Second script sent for the following month.11/15/2020 We will continue the opioid monitoring program, this consists of regular clinic visits, examinations, urine drug screen, pill counts as well as use of [place] Controlled Substance Reporting system. A 12 month History has been reviewed on the [place] Controlled Substance Reporting Systemon02/18/2022.   F/U in 2 months.

## 2020-11-21 LAB — DRUG TOX MONITOR 1 W/CONF, ORAL FLD
Amphetamines: NEGATIVE ng/mL (ref <10)
Barbiturates: NEGATIVE ng/mL (ref <10)
Benzodiazepines: NEGATIVE ng/mL (ref <0.50)
Buprenorphine: 0.39 ng/mL — ABNORMAL HIGH (ref <0.10)
Buprenorphine: POSITIVE ng/mL — AB (ref <0.10)
Cocaine: NEGATIVE ng/mL (ref <5.0)
Codeine: NEGATIVE ng/mL (ref <2.5)
Dihydrocodeine: 6.1 ng/mL — ABNORMAL HIGH (ref <2.5)
Fentanyl: NEGATIVE ng/mL (ref <0.10)
Heroin Metabolite: NEGATIVE ng/mL (ref <1.0)
Hydrocodone: 18.8 ng/mL — ABNORMAL HIGH (ref <2.5)
Hydromorphone: NEGATIVE ng/mL (ref <2.5)
MARIJUANA: NEGATIVE ng/mL (ref <2.5)
MDMA: NEGATIVE ng/mL (ref <10)
Meprobamate: NEGATIVE ng/mL (ref <2.5)
Methadone: NEGATIVE ng/mL (ref <5.0)
Morphine: NEGATIVE ng/mL (ref <2.5)
Naloxone: NEGATIVE ng/mL (ref <0.25)
Nicotine Metabolite: NEGATIVE ng/mL (ref <5.0)
Norbuprenorphine: NEGATIVE ng/mL (ref <0.50)
Norhydrocodone: NEGATIVE ng/mL (ref <2.5)
Noroxycodone: NEGATIVE ng/mL (ref <2.5)
Opiates: POSITIVE ng/mL — AB (ref <2.5)
Oxycodone: NEGATIVE ng/mL (ref <2.5)
Oxymorphone: NEGATIVE ng/mL (ref <2.5)
Phencyclidine: NEGATIVE ng/mL (ref <10)
Tapentadol: NEGATIVE ng/mL (ref <5.0)
Tramadol: NEGATIVE ng/mL (ref <5.0)
Zolpidem: NEGATIVE ng/mL (ref <5.0)

## 2020-11-21 LAB — DRUG TOX ALC METAB W/CON, ORAL FLD: Alcohol Metabolite: NEGATIVE ng/mL (ref <25)

## 2021-01-09 ENCOUNTER — Encounter: Payer: Medicare Other | Attending: Physical Medicine & Rehabilitation | Admitting: Registered Nurse

## 2021-01-09 ENCOUNTER — Encounter: Payer: Self-pay | Admitting: Registered Nurse

## 2021-01-09 ENCOUNTER — Other Ambulatory Visit: Payer: Self-pay

## 2021-01-09 VITALS — BP 107/76 | HR 93 | Temp 97.9°F | Ht 67.0 in | Wt 191.2 lb

## 2021-01-09 DIAGNOSIS — G8929 Other chronic pain: Secondary | ICD-10-CM | POA: Insufficient documentation

## 2021-01-09 DIAGNOSIS — M961 Postlaminectomy syndrome, not elsewhere classified: Secondary | ICD-10-CM | POA: Insufficient documentation

## 2021-01-09 DIAGNOSIS — M546 Pain in thoracic spine: Secondary | ICD-10-CM | POA: Diagnosis not present

## 2021-01-09 DIAGNOSIS — M5416 Radiculopathy, lumbar region: Secondary | ICD-10-CM | POA: Diagnosis not present

## 2021-01-09 DIAGNOSIS — G894 Chronic pain syndrome: Secondary | ICD-10-CM | POA: Insufficient documentation

## 2021-01-09 DIAGNOSIS — Z5181 Encounter for therapeutic drug level monitoring: Secondary | ICD-10-CM | POA: Diagnosis not present

## 2021-01-09 DIAGNOSIS — Z79891 Long term (current) use of opiate analgesic: Secondary | ICD-10-CM | POA: Diagnosis not present

## 2021-01-09 MED ORDER — HYDROCODONE-ACETAMINOPHEN 7.5-325 MG PO TABS: 1.0000 | ORAL_TABLET | Freq: Four times a day (QID) | ORAL | 0 refills | Status: DC | PRN

## 2021-01-09 NOTE — Progress Notes (Signed)
Subjective:    Patient ID: Sean [name], male    DOB: 06/17/1946, 75 y.o.   MRN: [id number]  HPI: Sean [name] is a 75 y.o. male who returns for follow up appointment for chronic pain and medication refill. He states his pain is located in his mid- lower back radiating into his left lower extremity. He rates his pain 5. His current exercise regime is walking and performing stretching exercises.  Sean [name] Morphine equivalent is 30.00  MME.  Sean [name] last UDS was reviewed, Sean [name] states he is compliant with his medication, and hasn't taken any other medication. UDS ordered today.   Pain Inventory Average Pain 5 Pain Right Now 5 My pain is aching  In the last 24 hours, has pain interfered with the following? General activity 3 Relation with others 4 Enjoyment of life 4 What TIME of day is your pain at its worst? evening and night Sleep (in general) Poor  Pain is worse with: sitting and standing Pain improves with: heat/ice, medication and TENS Relief from Meds: 5  No family history on file. Social History   Socioeconomic History  . Marital status: Married    Spouse name: Not on file  . Number of children: Not on file  . Years of education: Not on file  . Highest education level: Not on file  Occupational History  . Not on file  Tobacco Use  . Smoking status: Former Smoker    Types: Cigarettes  . Smokeless tobacco: Never Used  Substance and Sexual Activity  . Alcohol use: Not Currently  . Drug use: Never  . Sexual activity: Not on file  Other Topics Concern  . Not on file  Social History Narrative  . Not on file   Social Determinants of Health   Financial Resource Strain: Not on file  Food Insecurity: Not on file  Transportation Needs: Not on file  Physical Activity: Not on file  Stress: Not on file  Social Connections: Not on file   Past Surgical History:  Procedure Laterality Date  . BACK SURGERY    . BRAIN SURGERY    . CHOLECYSTECTOMY     . KNEE ARTHROSCOPY    . NECK SURGERY    . SPINE SURGERY     Spinal Cord Stimulator  . THYROIDECTOMY     Past Surgical History:  Procedure Laterality Date  . BACK SURGERY    . BRAIN SURGERY    . CHOLECYSTECTOMY    . KNEE ARTHROSCOPY    . NECK SURGERY    . SPINE SURGERY     Spinal Cord Stimulator  . THYROIDECTOMY     Past Medical History:  Diagnosis Date  . Diabetes ([place])   . Hypercholesteremia   . Hypertension   . Kidney stone   . Peptic ulcer disease    BP 107/76   Pulse 93   Temp 97.9 F (36.6 C)   Ht 5\' 7"  (1.702 m)   Wt 191 lb 3.2 oz (86.7 kg)   SpO2 95%   BMI 29.95 kg/m   Opioid Risk Score:   Fall Risk Score:  `1  Depression screen PHQ 2/9  Depression screen [facility] 2/9 11/15/2020 03/26/2020 09/20/2019 02/28/2019 01/02/2019  Decreased Interest 0 0 0 0 0  Down, Depressed, Hopeless 0 0 0 0 0  PHQ - 2 Score 0 0 0 0 0      Review of Systems  Constitutional: Negative.   HENT: Negative.   Eyes: Negative.  Respiratory: Negative.   Cardiovascular: Negative.   Gastrointestinal: Negative.   Endocrine: Negative.   Genitourinary: Negative.   Musculoskeletal: Positive for back pain and gait problem.       Left leg pain  Skin: Negative.   Allergic/Immunologic: Negative.   Hematological: Negative.   Psychiatric/Behavioral: Negative.        Objective:   Physical Exam Vitals and nursing note reviewed.  Constitutional:      Appearance: Normal appearance.  Cardiovascular:     Rate and Rhythm: Normal rate and regular rhythm.     Pulses: Normal pulses.     Heart sounds: Normal heart sounds.  Pulmonary:     Effort: Pulmonary effort is normal.     Breath sounds: Normal breath sounds.  Musculoskeletal:     Cervical back: Normal range of motion and neck supple.     Comments: Normal Muscle Bulk and Muscle Testing Reveals:  Upper Extremities: Full ROM and Muscle Strength 5/5 Thoracic Paraspinal Tenderness: T-7-T-9  Lumbar Paraspinal Tenderness: L-1-L-3 Lower  Extremities: Full ROM and Muscle Strength 5.5 Arises from Table with ease Narrow Based Gait   Skin:    General: Skin is warm and dry.  Neurological:     Mental Status: He is alert and oriented to person, place, and time.  Psychiatric:        Mood and Affect: Mood normal.        Behavior: Behavior normal.           Assessment & Plan:  1. Lumbar Post Laminectomy Syndrome:Chronic Thoracic Back Pain and  BilateralLowerBack Pain:Continue HEP as Tolerated. Continue to Monitor.01/09/2021. 2. Left Lumbar Radiculitis:Continue to Monitor.04//14/2022 3. Chronic Pain Syndrome: Refilled: Hydrocodone 7.5/325 mg one tablet every 6 hours as needed for moderate pain #115. Second script sent for the following month.01/09/2021. We will continue the opioid monitoring program, this consists of regular clinic visits, examinations, urine drug screen, pill counts as well as use of [place] Controlled Substance Reporting system. A 12 month History has been reviewed on the [place] Controlled Substance Reporting Systemon04/14/2022  F/U in 2 months

## 2021-01-15 LAB — TOXASSURE SELECT,+ANTIDEPR,UR

## 2021-01-17 ENCOUNTER — Telehealth: Payer: Self-pay | Admitting: *Deleted

## 2021-01-17 NOTE — Telephone Encounter (Signed)
Urine drug screen for this encounter is consistent for prescribed medication 

## 2021-02-03 DIAGNOSIS — K219 Gastro-esophageal reflux disease without esophagitis: Secondary | ICD-10-CM | POA: Diagnosis not present

## 2021-02-03 DIAGNOSIS — I10 Essential (primary) hypertension: Secondary | ICD-10-CM | POA: Diagnosis not present

## 2021-02-03 DIAGNOSIS — E785 Hyperlipidemia, unspecified: Secondary | ICD-10-CM | POA: Diagnosis not present

## 2021-02-03 DIAGNOSIS — E118 Type 2 diabetes mellitus with unspecified complications: Secondary | ICD-10-CM | POA: Diagnosis not present

## 2021-02-03 DIAGNOSIS — E063 Autoimmune thyroiditis: Secondary | ICD-10-CM | POA: Diagnosis not present

## 2021-02-03 DIAGNOSIS — M545 Low back pain, unspecified: Secondary | ICD-10-CM | POA: Diagnosis not present

## 2021-02-03 DIAGNOSIS — G4700 Insomnia, unspecified: Secondary | ICD-10-CM | POA: Diagnosis not present

## 2021-02-03 DIAGNOSIS — J301 Allergic rhinitis due to pollen: Secondary | ICD-10-CM | POA: Diagnosis not present

## 2021-02-03 DIAGNOSIS — Z139 Encounter for screening, unspecified: Secondary | ICD-10-CM | POA: Diagnosis not present

## 2021-03-06 ENCOUNTER — Encounter: Payer: Medicare Other | Attending: Physical Medicine & Rehabilitation | Admitting: Registered Nurse

## 2021-03-06 ENCOUNTER — Other Ambulatory Visit: Payer: Self-pay

## 2021-03-06 ENCOUNTER — Encounter: Payer: Self-pay | Admitting: Registered Nurse

## 2021-03-06 VITALS — BP 119/80 | HR 83 | Temp 98.5°F | Ht 67.0 in | Wt 191.2 lb

## 2021-03-06 DIAGNOSIS — Z79891 Long term (current) use of opiate analgesic: Secondary | ICD-10-CM | POA: Diagnosis not present

## 2021-03-06 DIAGNOSIS — Z5181 Encounter for therapeutic drug level monitoring: Secondary | ICD-10-CM | POA: Insufficient documentation

## 2021-03-06 DIAGNOSIS — M546 Pain in thoracic spine: Secondary | ICD-10-CM

## 2021-03-06 DIAGNOSIS — M5416 Radiculopathy, lumbar region: Secondary | ICD-10-CM | POA: Diagnosis not present

## 2021-03-06 DIAGNOSIS — G8929 Other chronic pain: Secondary | ICD-10-CM | POA: Insufficient documentation

## 2021-03-06 DIAGNOSIS — M961 Postlaminectomy syndrome, not elsewhere classified: Secondary | ICD-10-CM | POA: Diagnosis not present

## 2021-03-06 DIAGNOSIS — G894 Chronic pain syndrome: Secondary | ICD-10-CM | POA: Diagnosis not present

## 2021-03-06 MED ORDER — HYDROCODONE-ACETAMINOPHEN 7.5-325 MG PO TABS: 1.0000 | ORAL_TABLET | Freq: Four times a day (QID) | ORAL | 0 refills | Status: DC | PRN

## 2021-03-06 NOTE — Progress Notes (Signed)
Subjective:    Patient ID: Sean [name], male    DOB: Aug 05, 1946, 75 y.o.   MRN: [id number]  HPI: Markos L Justo is a 75 y.o. male who returns for follow up appointment for chronic pain and medication refill. He states his pain is located in his mid- lower back pain radiating into his left lower extremity. 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 He rates his pain 6. His current exercise regime is walking and performing stretching exercises.  Mr. Axelson Morphine equivalent is 30.00 MME.   Last UDS was Performed on 01/09/2021, it was consistent.      Pain Inventory Average Pain 6 Pain Right Now 6 My pain is constant and sharp  In the last 24 hours, has pain interfered with the following? General activity 8 Relation with others 8 Enjoyment of life 10 What TIME of day is your pain at its worst? night Sleep (in general) Poor  Pain is worse with: unsure Pain improves with: medication Relief from Meds: 5  No family history on file. Social History   Socioeconomic History   Marital status: Married    Spouse name: Not on file   Number of children: Not on file   Years of education: Not on file   Highest education level: Not on file  Occupational History   Not on file  Tobacco Use   Smoking status: Former    Pack years: 0.00    Types: Cigarettes   Smokeless tobacco: Never  Substance and Sexual Activity   Alcohol use: Not Currently   Drug use: Never   Sexual activity: Not on file  Other Topics Concern   Not on file  Social History Narrative   Not on file   Social Determinants of Health   Financial Resource Strain: Not on file  Food Insecurity: Not on file  Transportation Needs: Not on file  Physical Activity: Not on file  Stress: Not on file  Social Connections: Not on file   Past Surgical History:  Procedure Laterality Date   BACK SURGERY     BRAIN  SURGERY     CHOLECYSTECTOMY     KNEE ARTHROSCOPY     NECK SURGERY     SPINE SURGERY     Spinal Cord Stimulator   THYROIDECTOMY     Past Surgical History:  Procedure Laterality Date   BACK SURGERY     BRAIN SURGERY     CHOLECYSTECTOMY     KNEE ARTHROSCOPY     NECK SURGERY     SPINE SURGERY     Spinal Cord Stimulator   THYROIDECTOMY     Past Medical History:  Diagnosis Date   Diabetes ([place])    Hypercholesteremia    Hypertension    Kidney stone    Peptic ulcer disease    There were no vitals taken for this visit.  Opioid Risk Score:   Fall Risk Score:  `1  Depression screen PHQ 2/9  Depression screen [facility] 2/9 01/09/2021 11/15/2020 03/26/2020 09/20/2019 02/28/2019 01/02/2019  Decreased Interest 0 0 0 0 0 0  Down, Depressed, Hopeless 0 0 0 0 0 0  PHQ - 2 Score 0 0 0 0 0 0    Review of Systems  Musculoskeletal:  Positive for back pain.       Left leg pain  All other systems reviewed and are negative.  Objective:   Physical Exam Vitals and nursing note reviewed.  Constitutional:      Appearance: Normal appearance.  Cardiovascular:     Rate and Rhythm: Normal rate and regular rhythm.  Pulmonary:     Effort: Pulmonary effort is normal.     Breath sounds: Normal breath sounds.  Musculoskeletal:     Cervical back: Normal range of motion and neck supple.     Comments: Normal Muscle Bulk and Muscle Testing Reveals:  Upper Extremities: Full ROM and Muscle Strength  5/5 Thoracic Paraspinal Tenderness: T-10- T-12 Lumbar Paraspinal Tenderness: L-3-L-5 Lower Extremities: Full ROM and Muscle Strength 5/5 Arises from chair with ease Narrow Based  Gait     Skin:    General: Skin is warm and dry.  Neurological:     Mental Status: He is alert and oriented to person, place, and time.  Psychiatric:        Mood and Affect: Mood normal.        Behavior: Behavior normal.         Assessment & Plan:  1. Lumbar Post Laminectomy Syndrome:Chronic Thoracic Back Pain and   Bilateral Lower  Back Pain:  Continue HEP as Tolerated. Continue to Monitor. 03/06/2021. 2. Left Lumbar Radiculitis: Continue current medication regimen. Continue to Monitor. 06//05/2021 3. Chronic Pain Syndrome: Refilled: Increased: Hydrocodone 7.5/325 mg one tablet every 6 hours as needed for moderate pain #135 Second script sent for the following month. 01/09/2021. We will continue the opioid monitoring program, this consists of regular clinic visits, examinations, urine drug screen, pill counts as well as use of [place] Controlled Substance Reporting system. A 12 month History has been reviewed on the [place] Controlled Substance Reporting System on 03/06/2021    F/U in 2 months

## 2021-04-21 DIAGNOSIS — R0981 Nasal congestion: Secondary | ICD-10-CM | POA: Diagnosis not present

## 2021-04-21 DIAGNOSIS — R051 Acute cough: Secondary | ICD-10-CM | POA: Diagnosis not present

## 2021-05-01 ENCOUNTER — Other Ambulatory Visit: Payer: Self-pay

## 2021-05-01 ENCOUNTER — Encounter: Payer: Self-pay | Admitting: Registered Nurse

## 2021-05-01 ENCOUNTER — Encounter: Payer: Medicare Other | Attending: Physical Medicine & Rehabilitation | Admitting: Registered Nurse

## 2021-05-01 VITALS — BP 128/82 | Ht 67.0 in | Wt 185.0 lb

## 2021-05-01 DIAGNOSIS — M546 Pain in thoracic spine: Secondary | ICD-10-CM | POA: Diagnosis not present

## 2021-05-01 DIAGNOSIS — M545 Low back pain, unspecified: Secondary | ICD-10-CM | POA: Diagnosis not present

## 2021-05-01 DIAGNOSIS — G8929 Other chronic pain: Secondary | ICD-10-CM

## 2021-05-01 DIAGNOSIS — M961 Postlaminectomy syndrome, not elsewhere classified: Secondary | ICD-10-CM

## 2021-05-01 DIAGNOSIS — Z5181 Encounter for therapeutic drug level monitoring: Secondary | ICD-10-CM

## 2021-05-01 DIAGNOSIS — Z79891 Long term (current) use of opiate analgesic: Secondary | ICD-10-CM | POA: Diagnosis not present

## 2021-05-01 DIAGNOSIS — G894 Chronic pain syndrome: Secondary | ICD-10-CM | POA: Diagnosis not present

## 2021-05-01 MED ORDER — HYDROCODONE-ACETAMINOPHEN 7.5-325 MG PO TABS: 1.0000 | ORAL_TABLET | Freq: Four times a day (QID) | ORAL | 0 refills | Status: DC | PRN

## 2021-05-01 NOTE — Progress Notes (Signed)
Subjective:    Patient ID: Sean [name], male    DOB: May 13, 1946, 75 y.o.   MRN: [id number]  HPI: Sean [name] is a 75 y.o. male whose appointment was changed to a My-Chart office visit. His daughter sent a My-Chart reporting Sean [name] was coghing, sneezing with generalized weakness. He took a Home COVID test it was negative. Sean [name] PCP following, we changed his visit to My-Chart Video visit, Sean [name] agrees with My-Chart video visit and verbalizes understanding. He states his  pain is located in his mid- back. He rates his pain 6. current exercise regime is walking   Sean [name] Morphine equivalent is 33.75 MME.   Last UDS Performed 01/09/2021, it was consistent.     Pain Inventory Average Pain 6 Pain Right Now 6 My pain is sharp and aching  In the last 24 hours, has pain interfered with the following? General activity 6 Relation with others 6 Enjoyment of life 6 What TIME of day is your pain at its worst? night Sleep (in general) Poor  Pain is worse with: walking, bending, sitting, standing, and some activites Pain improves with: medication Relief from Meds: 5  No family history on file. Social History   Socioeconomic History   Marital status: Married    Spouse name: Not on file   Number of children: Not on file   Years of education: Not on file   Highest education level: Not on file  Occupational History   Not on file  Tobacco Use   Smoking status: Former    Types: Cigarettes   Smokeless tobacco: Never  Vaping Use   Vaping Use: Never used  Substance and Sexual Activity   Alcohol use: Not Currently   Drug use: Never   Sexual activity: Not on file  Other Topics Concern   Not on file  Social History Narrative   Not on file   Social Determinants of Health   Financial Resource Strain: Not on file  Food Insecurity: Not on file  Transportation Needs: Not on file  Physical Activity: Not on file  Stress: Not on file  Social Connections: Not on file    Past Surgical History:  Procedure Laterality Date   BACK SURGERY     BRAIN SURGERY     CHOLECYSTECTOMY     KNEE ARTHROSCOPY     NECK SURGERY     SPINE SURGERY     Spinal Cord Stimulator   THYROIDECTOMY     Past Surgical History:  Procedure Laterality Date   BACK SURGERY     BRAIN SURGERY     CHOLECYSTECTOMY     KNEE ARTHROSCOPY     NECK SURGERY     SPINE SURGERY     Spinal Cord Stimulator   THYROIDECTOMY     Past Medical History:  Diagnosis Date   Diabetes ([place])    Hypercholesteremia    Hypertension    Kidney stone    Peptic ulcer disease    BP 128/82 Comment: pt reported, virtual visit  Ht '5\' 7"'$  (1.702 m) Comment: pt reported, virtual visit  Wt 185 lb (83.9 kg) Comment: pt reported, virtual visit  BMI 28.98 kg/m   Opioid Risk Score:   Fall Risk Score:  `1  Depression screen PHQ 2/9  Depression screen [facility] 2/9 03/06/2021 01/09/2021 11/15/2020 03/26/2020 09/20/2019 02/28/2019 01/02/2019  Decreased Interest 0 0 0 0 0 0 0  Down, Depressed, Hopeless 0 0 0 0 0 0 0  PHQ - 2  Score 0 0 0 0 0 0 0     Review of Systems  Musculoskeletal:  Positive for back pain.  All other systems reviewed and are negative.     Objective:   Physical Exam Vitals and nursing note reviewed.  Musculoskeletal:     Comments: No Physical Exam Performed: My-Chart Video Visit         Assessment & Plan:  1. Lumbar Post Laminectomy Syndrome:Chronic Thoracic Back Pain and  Bilateral Lower  Back Pain:  Continue HEP as Tolerated. Continue to Monitor. 05/01/2021. 2. Left Lumbar Radiculitis: Continue current medication regimen. Continue to Monitor. 05/01/2021 3. Chronic Pain Syndrome: Refilled:  Hydrocodone 7.5/325 mg one tablet every 6 hours as needed for moderate pain #135 Second script sent for the following month. 05/01/2021. We will continue the opioid monitoring program, this consists of regular clinic visits, examinations, urine drug screen, pill counts as well as use of [place]  Controlled Substance Reporting system. A 12 month History has been reviewed on the [place] on 05/01/2021    F/U in 2 months  My-Chart Video Visit Established Patient Location of Patient: In his Home Location of Provider: In the Office

## 2021-06-25 DIAGNOSIS — E1140 Type 2 diabetes mellitus with diabetic neuropathy, unspecified: Secondary | ICD-10-CM | POA: Diagnosis not present

## 2021-06-25 DIAGNOSIS — I10 Essential (primary) hypertension: Secondary | ICD-10-CM | POA: Diagnosis not present

## 2021-06-25 DIAGNOSIS — K219 Gastro-esophageal reflux disease without esophagitis: Secondary | ICD-10-CM | POA: Diagnosis not present

## 2021-06-25 DIAGNOSIS — Z23 Encounter for immunization: Secondary | ICD-10-CM | POA: Diagnosis not present

## 2021-06-25 DIAGNOSIS — E785 Hyperlipidemia, unspecified: Secondary | ICD-10-CM | POA: Diagnosis not present

## 2021-06-25 DIAGNOSIS — E063 Autoimmune thyroiditis: Secondary | ICD-10-CM | POA: Diagnosis not present

## 2021-07-01 ENCOUNTER — Encounter: Payer: Medicare Other | Attending: Physical Medicine & Rehabilitation | Admitting: Registered Nurse

## 2021-07-01 ENCOUNTER — Other Ambulatory Visit: Payer: Self-pay

## 2021-07-01 ENCOUNTER — Encounter: Payer: Self-pay | Admitting: Registered Nurse

## 2021-07-01 VITALS — BP 107/74 | HR 100 | Temp 98.4°F | Ht 67.0 in | Wt 177.0 lb

## 2021-07-01 DIAGNOSIS — G894 Chronic pain syndrome: Secondary | ICD-10-CM | POA: Diagnosis not present

## 2021-07-01 DIAGNOSIS — Z79891 Long term (current) use of opiate analgesic: Secondary | ICD-10-CM | POA: Insufficient documentation

## 2021-07-01 DIAGNOSIS — Z5181 Encounter for therapeutic drug level monitoring: Secondary | ICD-10-CM | POA: Diagnosis not present

## 2021-07-01 DIAGNOSIS — G8929 Other chronic pain: Secondary | ICD-10-CM | POA: Insufficient documentation

## 2021-07-01 DIAGNOSIS — M5416 Radiculopathy, lumbar region: Secondary | ICD-10-CM | POA: Insufficient documentation

## 2021-07-01 DIAGNOSIS — M961 Postlaminectomy syndrome, not elsewhere classified: Secondary | ICD-10-CM | POA: Diagnosis not present

## 2021-07-01 DIAGNOSIS — M546 Pain in thoracic spine: Secondary | ICD-10-CM | POA: Insufficient documentation

## 2021-07-01 MED ORDER — HYDROCODONE-ACETAMINOPHEN 7.5-325 MG PO TABS: 1.0000 | ORAL_TABLET | Freq: Four times a day (QID) | ORAL | 0 refills | Status: DC | PRN

## 2021-07-01 NOTE — Progress Notes (Signed)
Subjective:    Patient ID: Sean [name], male    DOB: [birth year], 75 y.o.   MRN: [id number]  HPI: Sean [name] is a 75 y.o. male who returns for follow up appointment for chronic pain and medication refill. He states his pain is located in his mid- lower back pain radiating into his left hip. He rates his pain 8. His current exercise regime is walking and performing stretching exercises.  [name] Morphine equivalent is 31.50 MME.   Last UDS was Performed on 01/10/2020, it was consistent.      Pain Inventory Average Pain 8 Pain Right Now 8 My pain is constant, sharp, and tingling  In the last 24 hours, has pain interfered with the following? General activity 5 Relation with others 4 Enjoyment of life 5 What TIME of day is your pain at its worst? night Sleep (in general) Poor  Pain is worse with: bending and sitting Pain improves with: heat/ice, medication, and TENS Relief from Meds:  fair  History reviewed. No pertinent family history. Social History   Socioeconomic History   Marital status: Married    Spouse name: Not on file   Number of children: Not on file   Years of education: Not on file   Highest education level: Not on file  Occupational History   Not on file  Tobacco Use   Smoking status: Former    Types: Cigarettes   Smokeless tobacco: Never  Vaping Use   Vaping Use: Never used  Substance and Sexual Activity   Alcohol use: Not Currently   Drug use: Never   Sexual activity: Not on file  Other Topics Concern   Not on file  Social History Narrative   Not on file   Social Determinants of Health   Financial Resource Strain: Not on file  Food Insecurity: Not on file  Transportation Needs: Not on file  Physical Activity: Not on file  Stress: Not on file  Social Connections: Not on file   Past Surgical History:  Procedure Laterality Date   [place] ARTHROSCOPY     NECK SURGERY      SPINE SURGERY     Spinal Cord Stimulator   THYROIDECTOMY     Past Surgical History:  Procedure Laterality Date   BACK SURGERY     BRAIN SURGERY     CHOLECYSTECTOMY     KNEE ARTHROSCOPY     NECK SURGERY     SPINE SURGERY     Spinal Cord Stimulator   THYROIDECTOMY     Past Medical History:  Diagnosis Date   Diabetes ([place])    Hypercholesteremia    Hypertension    Kidney stone    Peptic ulcer disease    BP 107/74   Pulse 100   Temp 98.4 F (36.9 C)   Ht 5\' 7"  (1.702 m)   Wt 177 lb (80.3 kg)   BMI 27.72 kg/m   Opioid Risk Score:   Fall Risk Score:  `1  Depression screen PHQ 2/9  Depression screen [facility] 2/9 03/06/2021 01/09/2021 11/15/2020 03/26/2020 09/20/2019 02/28/2019 01/02/2019  Decreased Interest 0 0 0 0 0 0 0  Down, Depressed, Hopeless 0 0 0 0 0 0 0  PHQ - 2 Score 0 0 0 0 0 0 0    Review of Systems  Musculoskeletal:  Positive for back pain.       Left  leg and left hip pain  All other systems reviewed and are negative.     Objective:   Physical Exam Vitals and nursing note reviewed.  Constitutional:      Appearance: Normal appearance.  Cardiovascular:     Rate and Rhythm: Normal rate and regular rhythm.     Pulses: Normal pulses.     Heart sounds: Normal heart sounds.  Pulmonary:     Effort: Pulmonary effort is normal.     Breath sounds: Normal breath sounds.  Musculoskeletal:     Cervical back: Normal range of motion and neck supple.     Comments: Normal Muscle Bulk and Muscle Testing Reveals:  Upper Extremities: Full ROM and Muscle Strength 5/5  Thoracic Paraspinal Tenderness: T-7-T-9 Mainly Right Side Lumbar Paraspinal Tenderness: L-4-L-5 Lower Extremities: Full ROM and Muscle Strength 5/5 Arises from chair with ease Narrow Based  Gait     Skin:    General: Skin is warm and dry.  Neurological:     Mental Status: He is alert and oriented to person, place, and time.  Psychiatric:        Mood and Affect: Mood normal.        Behavior: Behavior normal.          Assessment & Plan:  1. Lumbar Post Laminectomy Syndrome:Chronic Thoracic Back Pain and  Bilateral Lower  Back Pain:  Continue HEP as Tolerated. Continue to Monitor. 07/01/2021. 2. Left Lumbar Radiculitis: Continue current medication regimen. Continue to Monitor. 07/01/2021 3. Chronic Pain Syndrome: Refilled:  Hydrocodone 7.5/325 mg one tablet every 6 hours as needed for moderate pain #135 Second script sent for the following month. 07/01/2021. We will continue the opioid monitoring program, this consists of regular clinic visits, examinations, urine drug screen, pill counts as well as use of [place] Controlled Substance Reporting system. A 12 month History has been reviewed on the [place] Controlled Substance Reporting System on 07/01/2021    F/U in 2 months

## 2021-07-01 NOTE — Patient Instructions (Addendum)
Ms. Dineen Kid ( Daughter) , Can you send me a My Chart message on 11/17 or 08/15/2021, about sending Mr. Gail prescription for December. I will be on vacation and his appointment will be later in the month of December   . Thank you.

## 2021-08-15 ENCOUNTER — Telehealth: Payer: Self-pay | Admitting: Registered Nurse

## 2021-08-15 DIAGNOSIS — M961 Postlaminectomy syndrome, not elsewhere classified: Secondary | ICD-10-CM

## 2021-08-15 MED ORDER — HYDROCODONE-ACETAMINOPHEN 7.5-325 MG PO TABS: 1.0000 | ORAL_TABLET | Freq: Four times a day (QID) | ORAL | 0 refills | Status: DC | PRN

## 2021-08-15 NOTE — Telephone Encounter (Signed)
PMP was reviewed. Hydrocodone e-scribed today.  Sean [name] is aware via My-Chart message.

## 2021-09-09 ENCOUNTER — Encounter: Payer: Medicare Other | Attending: Physical Medicine & Rehabilitation | Admitting: Registered Nurse

## 2021-09-09 ENCOUNTER — Encounter: Payer: Self-pay | Admitting: Registered Nurse

## 2021-09-09 ENCOUNTER — Other Ambulatory Visit: Payer: Self-pay

## 2021-09-09 VITALS — BP 128/78 | HR 100 | Temp 98.4°F | Ht 67.0 in | Wt 177.0 lb

## 2021-09-09 DIAGNOSIS — Z79891 Long term (current) use of opiate analgesic: Secondary | ICD-10-CM | POA: Insufficient documentation

## 2021-09-09 DIAGNOSIS — M5416 Radiculopathy, lumbar region: Secondary | ICD-10-CM | POA: Insufficient documentation

## 2021-09-09 DIAGNOSIS — Z5181 Encounter for therapeutic drug level monitoring: Secondary | ICD-10-CM | POA: Insufficient documentation

## 2021-09-09 DIAGNOSIS — G894 Chronic pain syndrome: Secondary | ICD-10-CM | POA: Diagnosis not present

## 2021-09-09 DIAGNOSIS — M961 Postlaminectomy syndrome, not elsewhere classified: Secondary | ICD-10-CM | POA: Diagnosis not present

## 2021-09-09 NOTE — Progress Notes (Signed)
Subjective:    Patient ID: Sean [name], male    DOB: April 23, 1946, 75 y.o.   MRN: [id number]  HPI: Sean [name] is a 75 y.o. male whose appointment was changed to My-Chart Video Visit, his daughter sent My-Chart Message expressing Sean [name] concern with office visit with increase number of COVID and Flu in the community. His visit was changed to My-Chart Visit, he verbalizes understanding. He rverbalizes understanding. He states his pain is located in  his mid- lower back radiating into his left lower extremity. He rates his pain 5. His current exercise regime is walking and performing stretching exercises.  Sean [name] Morphine equivalent is 33.75 MME.  Last UDS was Performed on 01/09/2021, it was consistent.     Pain Inventory Average Pain 6 Pain Right Now 5 My pain is intermittent, tingling, and aching  In the last 24 hours, has pain interfered with the following? General activity 9 Relation with others 0 Enjoyment of life 0 What TIME of day is your pain at its worst? night Sleep (in general) Poor  Pain is worse with: sitting and laying Pain improves with: heat/ice and medication Relief from Meds: 6  History reviewed. No pertinent family history. Social History   Socioeconomic History   Marital status: Married    Spouse name: Not on file   Number of children: Not on file   Years of education: Not on file   Highest education level: Not on file  Occupational History   Not on file  Tobacco Use   Smoking status: Former    Types: Cigarettes   Smokeless tobacco: Never  Vaping Use   Vaping Use: Never used  Substance and Sexual Activity   Alcohol use: Not Currently   Drug use: Never   Sexual activity: Not on file  Other Topics Concern   Not on file  Social History Narrative   Not on file   Social Determinants of Health   Financial Resource Strain: Not on file  Food Insecurity: Not on file  Transportation Needs: Not on file  Physical Activity: Not on file   Stress: Not on file  Social Connections: Not on file   Past Surgical History:  Procedure Laterality Date   BACK SURGERY     BRAIN SURGERY     CHOLECYSTECTOMY     KNEE ARTHROSCOPY     NECK SURGERY     SPINE SURGERY     Spinal Cord Stimulator   THYROIDECTOMY     Past Surgical History:  Procedure Laterality Date   BACK SURGERY     BRAIN SURGERY     CHOLECYSTECTOMY     KNEE ARTHROSCOPY     NECK SURGERY     SPINE SURGERY     Spinal Cord Stimulator   THYROIDECTOMY     Past Medical History:  Diagnosis Date   Diabetes ([place])    Hypercholesteremia    Hypertension    Kidney stone    Peptic ulcer disease    BP 128/78 Comment: per patient   Ht 5\' 7"  (1.702 m)    BMI 27.72 kg/m   Opioid Risk Score:   Fall Risk Score:  `1  Depression screen PHQ 2/9  Depression screen [facility] 2/9 09/09/2021 07/01/2021 03/06/2021 01/09/2021 11/15/2020 03/26/2020 09/20/2019  Decreased Interest 0 0 0 0 0 0 0  Down, Depressed, Hopeless 0 0 0 0 0 0 0  PHQ - 2 Score 0 0 0 0 0 0 0     Review of  Systems  Constitutional: Negative.   HENT: Negative.    Eyes: Negative.   Respiratory: Negative.    Cardiovascular: Negative.   Gastrointestinal: Negative.   Endocrine: Negative.   Genitourinary: Negative.   Musculoskeletal:  Positive for back pain.  Skin: Negative.   Allergic/Immunologic: Negative.   Neurological: Negative.   Hematological: Negative.   Psychiatric/Behavioral:  Positive for sleep disturbance.       Objective:   Physical Exam Vitals and nursing note reviewed.  Constitutional:      Appearance: Normal appearance.  Cardiovascular:     Rate and Rhythm: Normal rate and regular rhythm.     Pulses: Normal pulses.     Heart sounds: Normal heart sounds.  Pulmonary:     Effort: Pulmonary effort is normal.     Breath sounds: Normal breath sounds.  Musculoskeletal:     Cervical back: Normal range of motion and neck supple.     Comments: No Physical Exam Performed: My-Chart Video Visit   Skin:    General: Skin is warm and dry.  Neurological:     Mental Status: He is alert and oriented to person, place, and time.  Psychiatric:        Mood and Affect: Mood normal.        Behavior: Behavior normal.         Assessment & Plan:  1. Lumbar Post Laminectomy Syndrome:Chronic Thoracic Back Pain and  Bilateral Lower  Back Pain:  Continue HEP as Tolerated. Continue to Monitor. 09/09/2021. 2. Left Lumbar Radiculitis: Continue current medication regimen. Continue to Monitor. 09/09/2021 3. Chronic Pain Syndrome: Refilled:  Hydrocodone 7.5/325 mg one tablet every 6 hours as needed for moderate pain #135 Second script sent for the following month. 09/09/2021. We will continue the opioid monitoring program, this consists of regular clinic visits, examinations, urine drug screen, pill counts as well as use of [place] Controlled Substance Reporting system. A 12 month History has been reviewed on the [place] Controlled Substance Reporting System on 09/09/2021    F/U in 2 months My-Chart Video Visit and Audio Visit Established Patient Location of Patient: In his Car Location of Provider: In the Office

## 2021-09-23 ENCOUNTER — Telehealth: Payer: Self-pay | Admitting: Registered Nurse

## 2021-09-23 DIAGNOSIS — M961 Postlaminectomy syndrome, not elsewhere classified: Secondary | ICD-10-CM

## 2021-09-23 MED ORDER — HYDROCODONE-ACETAMINOPHEN 7.5-325 MG PO TABS: 1.0000 | ORAL_TABLET | Freq: Four times a day (QID) | ORAL | 0 refills | Status: DC | PRN

## 2021-09-23 NOTE — Telephone Encounter (Signed)
PMP was Reviewed.  Hydrocodone e-scribed today. Sent a message to Mr. Lorimer regarding the above. Placed a call to Mr. Donnamarie Rossetti daughter regarding the above, he will also be scheduled for February for an appointment she verbalizes understanding.  11/20/2021 at 1:00

## 2021-10-15 DIAGNOSIS — E063 Autoimmune thyroiditis: Secondary | ICD-10-CM | POA: Diagnosis not present

## 2021-10-15 DIAGNOSIS — E1165 Type 2 diabetes mellitus with hyperglycemia: Secondary | ICD-10-CM | POA: Diagnosis not present

## 2021-10-15 DIAGNOSIS — I10 Essential (primary) hypertension: Secondary | ICD-10-CM | POA: Diagnosis not present

## 2021-10-15 DIAGNOSIS — E785 Hyperlipidemia, unspecified: Secondary | ICD-10-CM | POA: Diagnosis not present

## 2021-10-27 ENCOUNTER — Telehealth: Payer: Self-pay | Admitting: Registered Nurse

## 2021-10-27 DIAGNOSIS — M961 Postlaminectomy syndrome, not elsewhere classified: Secondary | ICD-10-CM

## 2021-10-27 MED ORDER — HYDROCODONE-ACETAMINOPHEN 7.5-325 MG PO TABS: 1.0000 | ORAL_TABLET | Freq: Four times a day (QID) | ORAL | 0 refills | Status: DC | PRN

## 2021-10-27 NOTE — Telephone Encounter (Signed)
PMP was Reviewed.  Hydrocodone e-scribed today, sent a MY-Chart message to Sean [name],regarding the above.

## 2021-11-20 ENCOUNTER — Other Ambulatory Visit: Payer: Self-pay

## 2021-11-20 ENCOUNTER — Encounter: Payer: Self-pay | Admitting: Registered Nurse

## 2021-11-20 ENCOUNTER — Encounter: Payer: Medicare Other | Attending: Physical Medicine & Rehabilitation | Admitting: Registered Nurse

## 2021-11-20 VITALS — BP 104/73 | HR 96 | Temp 97.9°F | Ht 67.0 in | Wt 186.0 lb

## 2021-11-20 DIAGNOSIS — M961 Postlaminectomy syndrome, not elsewhere classified: Secondary | ICD-10-CM | POA: Insufficient documentation

## 2021-11-20 DIAGNOSIS — G8929 Other chronic pain: Secondary | ICD-10-CM | POA: Insufficient documentation

## 2021-11-20 DIAGNOSIS — M5416 Radiculopathy, lumbar region: Secondary | ICD-10-CM | POA: Insufficient documentation

## 2021-11-20 DIAGNOSIS — Z5181 Encounter for therapeutic drug level monitoring: Secondary | ICD-10-CM | POA: Insufficient documentation

## 2021-11-20 DIAGNOSIS — Z79891 Long term (current) use of opiate analgesic: Secondary | ICD-10-CM | POA: Insufficient documentation

## 2021-11-20 DIAGNOSIS — M546 Pain in thoracic spine: Secondary | ICD-10-CM | POA: Insufficient documentation

## 2021-11-20 DIAGNOSIS — G894 Chronic pain syndrome: Secondary | ICD-10-CM | POA: Insufficient documentation

## 2021-11-20 MED ORDER — HYDROCODONE-ACETAMINOPHEN 7.5-325 MG PO TABS: 1.0000 | ORAL_TABLET | Freq: Four times a day (QID) | ORAL | 0 refills | Status: DC | PRN

## 2021-11-20 NOTE — Progress Notes (Signed)
Subjective:    Patient ID: Sean [name], male    DOB: [birth year], 76 y.o.   MRN: [id number]  HPI: Sean [name] is a 76 y.o. male who returns for follow up appointment for chronic pain and medication refill. He states his pain is located in his mid- lower back radiating into his left lower extremity. Sean [name] states his current medication regimen is controlling his pain. We discussed other treatment regimen such as injection, he is not interested at this time. He will be scheduled with Dr Angelique Holm to disscuss other treatment modalities. He rates his pain 5. His current exercise regime is walking and performing stretching exercises.  Sean [name] Morphine equivalent is 33. 75 MME.   Oral Swab was Performed today.     Pain Inventory Average Pain 5 Pain Right Now 5 My pain is sharp  In the last 24 hours, has pain interfered with the following? General activity 5 Relation with others 5 Enjoyment of life 5 What TIME of day is your pain at its worst? night Sleep (in general) Poor  Pain is worse with: walking and sitting Pain improves with: rest, medication, and TENS Relief from Meds: 6  History reviewed. No pertinent family history. Social History   Socioeconomic History   Marital status: Married    Spouse name: Not on file   Number of children: Not on file   Years of education: Not on file   Highest education level: Not on file  Occupational History   Not on file  Tobacco Use   Smoking status: Former    Types: Cigarettes   Smokeless tobacco: Never  Vaping Use   Vaping Use: Never used  Substance and Sexual Activity   Alcohol use: Not Currently   Drug use: Never   Sexual activity: Not on file  Other Topics Concern   Not on file  Social History Narrative   Not on file   Social Determinants of Health   Financial Resource Strain: Not on file  Food Insecurity: Not on file  Transportation Needs: Not on file  Physical Activity: Not on file  Stress: Not on file   Social Connections: Not on file   Past Surgical History:  Procedure Laterality Date   [place]     Spinal Cord Stimulator   THYROIDECTOMY     Past Surgical History:  Procedure Laterality Date   BACK SURGERY     BRAIN SURGERY     CHOLECYSTECTOMY     KNEE ARTHROSCOPY     NECK SURGERY     SPINE SURGERY     Spinal Cord Stimulator   THYROIDECTOMY     Past Medical History:  Diagnosis Date   Diabetes ([place])    Hypercholesteremia    Hypertension    Kidney stone    Peptic ulcer disease    BP 104/73    Pulse 96    Temp 97.9 F (36.6 C)    Ht 5\' 7"  (1.702 m)    Wt 186 lb (84.4 kg)    SpO2 96%    BMI 29.13 kg/m   Opioid Risk Score:   Fall Risk Score:  `1  Depression screen PHQ 2/9  Depression screen [facility] 2/9 11/20/2021 09/09/2021 07/01/2021 03/06/2021 01/09/2021 11/15/2020 03/26/2020  Decreased Interest 0 0 0 0 0 0 0  Down, Depressed,  Hopeless 0 0 0 0 0 0 0  PHQ - 2 Score 0 0 0 0 0 0 0     Review of Systems  Constitutional: Negative.   HENT: Negative.    Eyes: Negative.   Respiratory: Negative.    Cardiovascular: Negative.   Gastrointestinal: Negative.   Endocrine: Negative.   Genitourinary: Negative.   Musculoskeletal:  Positive for back pain.  Skin: Negative.   Allergic/Immunologic: Negative.   Neurological: Negative.   Hematological: Negative.   Psychiatric/Behavioral: Negative.        Objective:   Physical Exam Vitals and nursing note reviewed.  Constitutional:      Appearance: Normal appearance.  Cardiovascular:     Rate and Rhythm: Normal rate and regular rhythm.     Pulses: Normal pulses.     Heart sounds: Normal heart sounds.  Pulmonary:     Effort: Pulmonary effort is normal.     Breath sounds: Normal breath sounds.  Musculoskeletal:     Cervical back: Normal range of motion and neck supple.     Comments: Normal Muscle Bulk and Muscle Testing Reveals:   Upper Extremities: Full ROM and Muscle Strength 5/5 Thoracic Paraspinal Tenderness: T-10-T-12  Lower Extremities: Full ROM and Muscle Strength 5/5 Arises from Table with ease Narrow Based  Gait     Skin:    General: Skin is warm and dry.  Neurological:     Mental Status: He is alert and oriented to person, place, and time.  Psychiatric:        Mood and Affect: Mood normal.        Behavior: Behavior normal.         Assessment & Plan:  1. Lumbar Post Laminectomy Syndrome:Chronic Thoracic Back Pain and  Bilateral Lower  Back Pain:  Continue HEP as Tolerated. Continue to Monitor. 11/20/2021. 2. Left Lumbar Radiculitis: Continue current medication regimen. Continue to Monitor. 11/20/2021 3. Chronic Pain Syndrome: Refilled:  Hydrocodone 7.5/325 mg one tablet every 6 hours as needed for moderate pain #135 Second script sent for the following month. 02/23/20223 We will continue the opioid monitoring program, this consists of regular clinic visits, examinations, urine drug screen, pill counts as well as use of [place] Controlled Substance Reporting system. A 12 month History has been reviewed on the [place] on 11/20/2021    F/U in 2 months

## 2021-11-27 LAB — DRUG TOX MONITOR 1 W/CONF, ORAL FLD
Amphetamines: NEGATIVE ng/mL (ref <10)
Barbiturates: NEGATIVE ng/mL (ref <10)
Benzodiazepines: NEGATIVE ng/mL (ref <0.50)
Buprenorphine: NEGATIVE ng/mL (ref <0.10)
Cocaine: NEGATIVE ng/mL (ref <5.0)
Codeine: NEGATIVE ng/mL (ref <2.5)
Dihydrocodeine: 23.2 ng/mL — ABNORMAL HIGH (ref <2.5)
Fentanyl: NEGATIVE ng/mL (ref <0.10)
Heroin Metabolite: NEGATIVE ng/mL (ref <1.0)
Hydrocodone: 105.6 ng/mL — ABNORMAL HIGH (ref <2.5)
Hydromorphone: NEGATIVE ng/mL (ref <2.5)
MARIJUANA: NEGATIVE ng/mL (ref <2.5)
MDMA: NEGATIVE ng/mL (ref <10)
Meprobamate: NEGATIVE ng/mL (ref <2.5)
Methadone: NEGATIVE ng/mL (ref <5.0)
Morphine: NEGATIVE ng/mL (ref <2.5)
Nicotine Metabolite: NEGATIVE ng/mL (ref <5.0)
Norhydrocodone: 2.8 ng/mL — ABNORMAL HIGH (ref <2.5)
Noroxycodone: NEGATIVE ng/mL (ref <2.5)
Opiates: POSITIVE ng/mL — AB (ref <2.5)
Oxycodone: NEGATIVE ng/mL (ref <2.5)
Oxymorphone: NEGATIVE ng/mL (ref <2.5)
Phencyclidine: NEGATIVE ng/mL (ref <10)
Tapentadol: NEGATIVE ng/mL (ref <5.0)
Tramadol: NEGATIVE ng/mL (ref <5.0)
Zolpidem: NEGATIVE ng/mL (ref <5.0)

## 2021-11-27 LAB — DRUG TOX ALC METAB W/CON, ORAL FLD: Alcohol Metabolite: NEGATIVE ng/mL (ref <25)

## 2021-12-01 ENCOUNTER — Telehealth: Payer: Self-pay | Admitting: *Deleted

## 2021-12-01 NOTE — Telephone Encounter (Signed)
Oral swab drug screen was consistent for prescribed medications.  ?

## 2022-01-13 DIAGNOSIS — E785 Hyperlipidemia, unspecified: Secondary | ICD-10-CM | POA: Diagnosis not present

## 2022-01-13 DIAGNOSIS — E063 Autoimmune thyroiditis: Secondary | ICD-10-CM | POA: Diagnosis not present

## 2022-01-13 DIAGNOSIS — E1165 Type 2 diabetes mellitus with hyperglycemia: Secondary | ICD-10-CM | POA: Diagnosis not present

## 2022-01-13 DIAGNOSIS — I10 Essential (primary) hypertension: Secondary | ICD-10-CM | POA: Diagnosis not present

## 2022-01-20 ENCOUNTER — Encounter: Payer: Self-pay | Admitting: Registered Nurse

## 2022-01-20 ENCOUNTER — Encounter: Payer: Medicare Other | Attending: Physical Medicine & Rehabilitation | Admitting: Registered Nurse

## 2022-01-20 VITALS — BP 118/81 | HR 88 | Ht 67.0 in | Wt 186.0 lb

## 2022-01-20 DIAGNOSIS — G894 Chronic pain syndrome: Secondary | ICD-10-CM

## 2022-01-20 DIAGNOSIS — Z79891 Long term (current) use of opiate analgesic: Secondary | ICD-10-CM

## 2022-01-20 DIAGNOSIS — M961 Postlaminectomy syndrome, not elsewhere classified: Secondary | ICD-10-CM

## 2022-01-20 DIAGNOSIS — M546 Pain in thoracic spine: Secondary | ICD-10-CM | POA: Insufficient documentation

## 2022-01-20 DIAGNOSIS — M5416 Radiculopathy, lumbar region: Secondary | ICD-10-CM | POA: Diagnosis not present

## 2022-01-20 DIAGNOSIS — G8929 Other chronic pain: Secondary | ICD-10-CM | POA: Diagnosis not present

## 2022-01-20 DIAGNOSIS — Z5181 Encounter for therapeutic drug level monitoring: Secondary | ICD-10-CM | POA: Diagnosis not present

## 2022-01-20 MED ORDER — HYDROCODONE-ACETAMINOPHEN 7.5-325 MG PO TABS: 1.0000 | ORAL_TABLET | Freq: Four times a day (QID) | ORAL | 0 refills | Status: DC | PRN

## 2022-01-20 NOTE — Progress Notes (Signed)
? ?Subjective:  ? ? Patient ID: Sean [name], male    DOB: 10-22-1945, 76 y.o.   MRN: [id number] ? ?HPI: Sean [name] is a 76 y.o. male who returns for follow up appointment for chronic pain and medication refill. He states his pain is located in his mid-lower back pain radiating into her left lower extremity.  He rates his pain 8. His current exercise regime is walking and performing stretching exercises. ? ?Mr. Kleinpeter Morphine equivalent is 33.75 MME.   Oral Swab was Performed today.  ?  ? ? ?Pain Inventory ?Average Pain 6 ?Pain Right Now 8 ?My pain is constant and aching ? ?In the last 24 hours, has pain interfered with the following? ?General activity 4 ?Relation with others 4 ?Enjoyment of life 9 ?What TIME of day is your pain at its worst? night ?Sleep (in general) Poor ? ?Pain is worse with: bending, sitting, and standing ?Pain improves with: heat/ice, medication, and TENS ?Relief from Meds: 8 ? ?History reviewed. No pertinent family history. ?Social History  ? ?Socioeconomic History  ? Marital status: Married  ?  Spouse name: Not on file  ? Number of children: Not on file  ? Years of education: Not on file  ? Highest education level: Not on file  ?Occupational History  ? Not on file  ?Tobacco Use  ? Smoking status: Former  ?  Types: Cigarettes  ? Smokeless tobacco: Never  ?Vaping Use  ? Vaping Use: Never used  ?Substance and Sexual Activity  ? Alcohol use: Not Currently  ? Drug use: Never  ? Sexual activity: Not on file  ?Other Topics Concern  ? Not on file  ?Social History Narrative  ? Not on file  ? ?Social Determinants of Health  ? ?Financial Resource Strain: Not on file  ?Food Insecurity: Not on file  ?Transportation Needs: Not on file  ?Physical Activity: Not on file  ?Stress: Not on file  ?Social Connections: Not on file  ? ?Past Surgical History:  ?Procedure Laterality Date  ? BACK SURGERY    ? BRAIN SURGERY    ? CHOLECYSTECTOMY    ? KNEE ARTHROSCOPY    ? NECK SURGERY    ? SPINE SURGERY    ?  Spinal Cord Stimulator  ? THYROIDECTOMY    ? ?Past Surgical History:  ?Procedure Laterality Date  ? BACK SURGERY    ? BRAIN SURGERY    ? CHOLECYSTECTOMY    ? KNEE ARTHROSCOPY    ? NECK SURGERY    ? SPINE SURGERY    ? Spinal Cord Stimulator  ? THYROIDECTOMY    ? ?Past Medical History:  ?Diagnosis Date  ? Diabetes ([place])   ? Hypercholesteremia   ? Hypertension   ? Kidney stone   ? Peptic ulcer disease   ? ?Ht '5\' 7"'$  (1.702 m)   BMI 29.13 kg/m?  ? ?Opioid Risk Score:   ?Fall Risk Score:  `1 ? ?Depression screen PHQ 2/9 ? ? ?  01/20/2022  ? 12:58 PM 11/20/2021  ? 12:56 PM 09/09/2021  ? 12:53 PM 07/01/2021  ?  1:05 PM 03/06/2021  ? 12:56 PM 01/09/2021  ?  1:10 PM 11/15/2020  ? 12:59 PM  ?Depression screen PHQ 2/9  ?Decreased Interest 0 0 0 0 0 0 0  ?Down, Depressed, Hopeless 0 0 0 0 0 0 0  ?PHQ - 2 Score 0 0 0 0 0 0 0  ? ? ?Review of Systems  ?Musculoskeletal:  Positive for back  pain.  ?     Left leg pain  ?All other systems reviewed and are negative. ? ?   ?Objective:  ? Physical Exam ?Vitals and nursing note reviewed.  ?Constitutional:   ?   Appearance: Normal appearance.  ?Cardiovascular:  ?   Rate and Rhythm: Normal rate and regular rhythm.  ?   Pulses: Normal pulses.  ?   Heart sounds: Normal heart sounds.  ?Pulmonary:  ?   Effort: Pulmonary effort is normal.  ?   Breath sounds: Normal breath sounds.  ?Musculoskeletal:  ?   Cervical back: Normal range of motion and neck supple.  ?   Comments: Normal Muscle Bulk and Muscle Testing Reveals:  ?Upper Extremities:Full  ROM and Muscle Strength 5/5 ?Thoracic Paraspinal Tenderness: T- 10- T-12  ?Lumbar Paraspinal Tenderness: L-4-L-5 ?Lower Extremities: Full ROM and Muscle Strength 5/5 ?Arises from Chair with Ease ?Narrow Based  Gait  ?   ?Skin: ?   General: Skin is warm and dry.  ?Neurological:  ?   Mental Status: He is alert and oriented to person, place, and time.  ?Psychiatric:     ?   Mood and Affect: Mood normal.     ?   Behavior: Behavior normal.  ? ? ? ? ?   ?Assessment &  Plan:  ?1. Lumbar Post Laminectomy Syndrome:Chronic Thoracic Back Pain and  Bilateral Lower  Back Pain:  Continue HEP as Tolerated. Continue to Monitor. 01/20/2022. ?2. Left Lumbar Radiculitis: Continue current medication regimen. Continue to Monitor. 01/20/2022 ?3. Chronic Pain Syndrome: Refilled:  Hydrocodone 7.5/325 mg one tablet every 6 hours as needed for moderate pain #135 Second script sent for the following month. 04/25/20223 ?We will continue the opioid monitoring program, this consists of regular clinic visits, examinations, urine drug screen, pill counts as well as use of [place] Controlled Substance Reporting system. A 12 month History has been reviewed on the [place] Controlled Substance Reporting System on 01/20/2022  ?  ?F/U in 2 months ? ?

## 2022-01-26 ENCOUNTER — Telehealth: Payer: Self-pay | Admitting: *Deleted

## 2022-01-26 LAB — DRUG TOX MONITOR 1 W/CONF, ORAL FLD
Amphetamines: NEGATIVE ng/mL (ref <10)
Barbiturates: NEGATIVE ng/mL (ref <10)
Benzodiazepines: NEGATIVE ng/mL (ref <0.50)
Buprenorphine: NEGATIVE ng/mL (ref <0.10)
Cocaine: NEGATIVE ng/mL (ref <5.0)
Codeine: NEGATIVE ng/mL (ref <2.5)
Dihydrocodeine: 9.9 ng/mL — ABNORMAL HIGH (ref <2.5)
Fentanyl: NEGATIVE ng/mL (ref <0.10)
Heroin Metabolite: NEGATIVE ng/mL (ref <1.0)
Hydrocodone: 101 ng/mL — ABNORMAL HIGH (ref <2.5)
Hydromorphone: NEGATIVE ng/mL (ref <2.5)
MARIJUANA: NEGATIVE ng/mL (ref <2.5)
MDMA: NEGATIVE ng/mL (ref <10)
Meprobamate: NEGATIVE ng/mL (ref <2.5)
Methadone: NEGATIVE ng/mL (ref <5.0)
Morphine: NEGATIVE ng/mL (ref <2.5)
Nicotine Metabolite: NEGATIVE ng/mL (ref <5.0)
Norhydrocodone: NEGATIVE ng/mL (ref <2.5)
Noroxycodone: NEGATIVE ng/mL (ref <2.5)
Opiates: POSITIVE ng/mL — AB (ref <2.5)
Oxycodone: NEGATIVE ng/mL (ref <2.5)
Oxymorphone: NEGATIVE ng/mL (ref <2.5)
Phencyclidine: NEGATIVE ng/mL (ref <10)
Tapentadol: NEGATIVE ng/mL (ref <5.0)
Tramadol: NEGATIVE ng/mL (ref <5.0)
Zolpidem: NEGATIVE ng/mL (ref <5.0)

## 2022-01-26 LAB — DRUG TOX ALC METAB W/CON, ORAL FLD: Alcohol Metabolite: NEGATIVE ng/mL (ref <25)

## 2022-01-26 NOTE — Telephone Encounter (Signed)
Oral swab drug screen was consistent for prescribed medications.  ?

## 2022-02-26 ENCOUNTER — Telehealth: Payer: Self-pay | Admitting: Registered Nurse

## 2022-02-26 MED ORDER — DICLOFENAC SODIUM 50 MG PO TBEC: 50.0000 mg | DELAYED_RELEASE_TABLET | Freq: Two times a day (BID) | ORAL | 1 refills | Status: AC

## 2022-02-26 NOTE — Telephone Encounter (Signed)
Last Chemistry 01/13/2022 BUN: 18 Creatine: 1.05 GFR 74 Was in contact with Sean [name] daughter via My-Chart Message Diclofenac ordered today, message sent to Sean [name]/ He will use Diclofenac daily for the first week , advised to take with food. He will send a update on My Chart message.  He is aware to discontinue Ibuprofen, and all NSAIDS.

## 2022-03-04 ENCOUNTER — Ambulatory Visit: Payer: Medicare Other | Admitting: Physical Medicine & Rehabilitation

## 2022-03-19 ENCOUNTER — Encounter: Payer: Self-pay | Admitting: Registered Nurse

## 2022-03-19 ENCOUNTER — Encounter: Payer: Medicare Other | Attending: Physical Medicine & Rehabilitation | Admitting: Registered Nurse

## 2022-03-19 VITALS — BP 116/77 | HR 74 | Ht 67.0 in | Wt 187.0 lb

## 2022-03-19 DIAGNOSIS — M961 Postlaminectomy syndrome, not elsewhere classified: Secondary | ICD-10-CM

## 2022-03-19 DIAGNOSIS — M5416 Radiculopathy, lumbar region: Secondary | ICD-10-CM | POA: Diagnosis not present

## 2022-03-19 DIAGNOSIS — G894 Chronic pain syndrome: Secondary | ICD-10-CM

## 2022-03-19 DIAGNOSIS — Z5181 Encounter for therapeutic drug level monitoring: Secondary | ICD-10-CM

## 2022-03-19 DIAGNOSIS — Z79891 Long term (current) use of opiate analgesic: Secondary | ICD-10-CM

## 2022-03-19 DIAGNOSIS — M546 Pain in thoracic spine: Secondary | ICD-10-CM | POA: Diagnosis not present

## 2022-03-19 DIAGNOSIS — G8929 Other chronic pain: Secondary | ICD-10-CM

## 2022-03-19 MED ORDER — HYDROCODONE-ACETAMINOPHEN 7.5-325 MG PO TABS: 1.0000 | ORAL_TABLET | Freq: Four times a day (QID) | ORAL | 0 refills | Status: DC | PRN

## 2022-03-19 NOTE — Progress Notes (Signed)
Subjective:    Patient ID: Sean [name], male    DOB: 10-17-1945, 76 y.o.   MRN: [id number]  HPI: Sean [name] is a 76 y.o. male who returns for follow up appointment for chronic pain and medication refill. He states his pain is located in his mid- lower back radiating into his left lower extremity. He  rates his pain 5. His current exercise regime is walking and performing stretching exercises.  Sean [name] Morphine equivalent is 33.75 MME.   Last Oral Swab was Performed on 01/20/2022, it was consistent.      Pain Inventory Average Pain 5 Pain Right Now 5 My pain is constant and dull  In the last 24 hours, has pain interfered with the following? General activity 5 Relation with others 5 Enjoyment of life 5 What TIME of day is your pain at its worst? night Sleep (in general) Poor  Pain is worse with: sitting, standing, and some activites Pain improves with: heat/ice, medication, and TENS Relief from Meds: 5  History reviewed. No pertinent family history. Social History   Socioeconomic History   Marital status: Married    Spouse name: Not on file   Number of children: Not on file   Years of education: Not on file   Highest education level: Not on file  Occupational History   Not on file  Tobacco Use   Smoking status: Former    Types: Cigarettes   Smokeless tobacco: Never  Vaping Use   Vaping Use: Never used  Substance and Sexual Activity   Alcohol use: Not Currently   Drug use: Never   Sexual activity: Not on file  Other Topics Concern   Not on file  Social History Narrative   Not on file   Social Determinants of Health   Financial Resource Strain: Not on file  Food Insecurity: Not on file  Transportation Needs: Not on file  Physical Activity: Not on file  Stress: Not on file  Social Connections: Not on file   Past Surgical History:  Procedure Laterality Date   [place]     Spinal Cord Stimulator   THYROIDECTOMY     Past Surgical History:  Procedure Laterality Date   BACK SURGERY     BRAIN SURGERY     CHOLECYSTECTOMY     KNEE ARTHROSCOPY     NECK SURGERY     SPINE SURGERY     Spinal Cord Stimulator   THYROIDECTOMY     Past Medical History:  Diagnosis Date   Diabetes ([place])    Hypercholesteremia    Hypertension    Kidney stone    Peptic ulcer disease    Ht '5\' 7"'$  (1.702 m)   Wt 187 lb (84.8 kg)   BMI 29.29 kg/m   Opioid Risk Score:   Fall Risk Score:  `1  Depression screen [facility] 2/9     03/19/2022   12:54 PM 01/20/2022   12:58 PM 11/20/2021   12:56 PM 09/09/2021   12:53 PM 07/01/2021    1:05 PM 03/06/2021   12:56 PM 01/09/2021    1:10 PM  Depression screen PHQ 2/9  Decreased Interest 0 0 0 0 0 0 0  Down, Depressed, Hopeless 0 0 0 0 0 0 0  PHQ - 2 Score 0 0 0 0 0 0  0    Review of Systems  Gastrointestinal:  Positive for abdominal pain.  Musculoskeletal:  Positive for back pain.       Left leg pain  All other systems reviewed and are negative.      Objective:   Physical Exam Vitals and nursing note reviewed.  Constitutional:      Appearance: Normal appearance.  Cardiovascular:     Rate and Rhythm: Normal rate and regular rhythm.     Pulses: Normal pulses.     Heart sounds: Normal heart sounds.  Pulmonary:     Effort: Pulmonary effort is normal.     Breath sounds: Normal breath sounds.  Musculoskeletal:     Cervical back: Normal range of motion and neck supple.     Comments: Normal Muscle Bulk and Muscle Testing Reveals:  Upper Extremities: Full  ROM and Muscle Strength 5/5  Thoracic Paraspinal Tenderness: T-7-T-9 Lumbar Paraspinal Tenderness: L-4-L-5 Mainly Left Side Lower Extremities: Full ROM and Muscle Strength 5/5 Arises from Chair with Ease Narrow Based  Gait     Skin:    General: Skin is warm and dry.  Neurological:     Mental Status: He is alert and oriented to person, place, and  time.  Psychiatric:        Mood and Affect: Mood normal.        Behavior: Behavior normal.         Assessment & Plan:  1. Lumbar Post Laminectomy Syndrome:Chronic Thoracic Back Pain and  Bilateral Lower  Back Pain:  Continue HEP as Tolerated. Continue to Monitor. 03/19/2022. 2. Left Lumbar Radiculitis: Continue current medication regimen. Continue to Monitor. 01/20/2022 3. Chronic Pain Syndrome: Refilled:  Hydrocodone 7.5/325 mg one tablet every 6 hours as needed for moderate pain #135 Second script sent for the following month. 06/22/20223 We will continue the opioid monitoring program, this consists of regular clinic visits, examinations, urine drug screen, pill counts as well as use of [place] Controlled Substance Reporting system. A 12 month History has been reviewed on the [place] Controlled Substance Reporting System on 03/19/2022    F/U in 2 months

## 2022-04-21 DIAGNOSIS — E063 Autoimmune thyroiditis: Secondary | ICD-10-CM | POA: Diagnosis not present

## 2022-04-21 DIAGNOSIS — E1165 Type 2 diabetes mellitus with hyperglycemia: Secondary | ICD-10-CM | POA: Diagnosis not present

## 2022-04-21 DIAGNOSIS — I10 Essential (primary) hypertension: Secondary | ICD-10-CM | POA: Diagnosis not present

## 2022-04-21 DIAGNOSIS — T466X5A Adverse effect of antihyperlipidemic and antiarteriosclerotic drugs, initial encounter: Secondary | ICD-10-CM | POA: Diagnosis not present

## 2022-04-21 DIAGNOSIS — K219 Gastro-esophageal reflux disease without esophagitis: Secondary | ICD-10-CM | POA: Diagnosis not present

## 2022-04-21 DIAGNOSIS — Z139 Encounter for screening, unspecified: Secondary | ICD-10-CM | POA: Diagnosis not present

## 2022-04-21 DIAGNOSIS — G720 Drug-induced myopathy: Secondary | ICD-10-CM | POA: Diagnosis not present

## 2022-04-21 DIAGNOSIS — E785 Hyperlipidemia, unspecified: Secondary | ICD-10-CM | POA: Diagnosis not present

## 2022-05-12 DIAGNOSIS — K219 Gastro-esophageal reflux disease without esophagitis: Secondary | ICD-10-CM | POA: Diagnosis not present

## 2022-05-12 DIAGNOSIS — R1030 Lower abdominal pain, unspecified: Secondary | ICD-10-CM | POA: Diagnosis not present

## 2022-05-12 DIAGNOSIS — E872 Acidosis, unspecified: Secondary | ICD-10-CM | POA: Diagnosis not present

## 2022-05-12 DIAGNOSIS — R109 Unspecified abdominal pain: Secondary | ICD-10-CM | POA: Diagnosis not present

## 2022-05-12 DIAGNOSIS — E119 Type 2 diabetes mellitus without complications: Secondary | ICD-10-CM | POA: Diagnosis not present

## 2022-05-12 DIAGNOSIS — M1990 Unspecified osteoarthritis, unspecified site: Secondary | ICD-10-CM | POA: Diagnosis not present

## 2022-05-12 DIAGNOSIS — R112 Nausea with vomiting, unspecified: Secondary | ICD-10-CM | POA: Diagnosis not present

## 2022-05-12 DIAGNOSIS — T675XXA Heat exhaustion, unspecified, initial encounter: Secondary | ICD-10-CM | POA: Diagnosis not present

## 2022-05-12 DIAGNOSIS — R918 Other nonspecific abnormal finding of lung field: Secondary | ICD-10-CM | POA: Diagnosis not present

## 2022-05-12 DIAGNOSIS — A419 Sepsis, unspecified organism: Secondary | ICD-10-CM | POA: Diagnosis not present

## 2022-05-12 DIAGNOSIS — Z79899 Other long term (current) drug therapy: Secondary | ICD-10-CM | POA: Diagnosis not present

## 2022-05-12 DIAGNOSIS — I10 Essential (primary) hypertension: Secondary | ICD-10-CM | POA: Diagnosis not present

## 2022-05-12 DIAGNOSIS — Z7982 Long term (current) use of aspirin: Secondary | ICD-10-CM | POA: Diagnosis not present

## 2022-05-12 DIAGNOSIS — E039 Hypothyroidism, unspecified: Secondary | ICD-10-CM | POA: Diagnosis not present

## 2022-05-12 DIAGNOSIS — I959 Hypotension, unspecified: Secondary | ICD-10-CM | POA: Diagnosis not present

## 2022-05-12 DIAGNOSIS — G8929 Other chronic pain: Secondary | ICD-10-CM | POA: Diagnosis not present

## 2022-05-12 DIAGNOSIS — M549 Dorsalgia, unspecified: Secondary | ICD-10-CM | POA: Diagnosis not present

## 2022-05-12 DIAGNOSIS — Z7984 Long term (current) use of oral hypoglycemic drugs: Secondary | ICD-10-CM | POA: Diagnosis not present

## 2022-05-12 DIAGNOSIS — R6520 Severe sepsis without septic shock: Secondary | ICD-10-CM | POA: Diagnosis not present

## 2022-05-12 DIAGNOSIS — Z87891 Personal history of nicotine dependence: Secondary | ICD-10-CM | POA: Diagnosis not present

## 2022-05-12 DIAGNOSIS — N179 Acute kidney failure, unspecified: Secondary | ICD-10-CM | POA: Diagnosis not present

## 2022-05-12 DIAGNOSIS — Z888 Allergy status to other drugs, medicaments and biological substances status: Secondary | ICD-10-CM | POA: Diagnosis not present

## 2022-05-12 DIAGNOSIS — J439 Emphysema, unspecified: Secondary | ICD-10-CM | POA: Diagnosis not present

## 2022-05-13 DIAGNOSIS — N179 Acute kidney failure, unspecified: Secondary | ICD-10-CM | POA: Diagnosis not present

## 2022-05-13 DIAGNOSIS — E872 Acidosis, unspecified: Secondary | ICD-10-CM | POA: Diagnosis not present

## 2022-05-13 DIAGNOSIS — T675XXA Heat exhaustion, unspecified, initial encounter: Secondary | ICD-10-CM | POA: Diagnosis not present

## 2022-05-14 DIAGNOSIS — T675XXA Heat exhaustion, unspecified, initial encounter: Secondary | ICD-10-CM | POA: Diagnosis not present

## 2022-05-14 DIAGNOSIS — N179 Acute kidney failure, unspecified: Secondary | ICD-10-CM | POA: Diagnosis not present

## 2022-05-14 DIAGNOSIS — E872 Acidosis, unspecified: Secondary | ICD-10-CM | POA: Diagnosis not present

## 2022-05-19 ENCOUNTER — Encounter: Payer: Self-pay | Admitting: Registered Nurse

## 2022-05-19 ENCOUNTER — Encounter: Payer: Medicare Other | Attending: Physical Medicine & Rehabilitation | Admitting: Registered Nurse

## 2022-05-19 VITALS — BP 110/73 | HR 79 | Ht 67.0 in | Wt 184.0 lb

## 2022-05-19 DIAGNOSIS — G894 Chronic pain syndrome: Secondary | ICD-10-CM | POA: Diagnosis not present

## 2022-05-19 DIAGNOSIS — Z79891 Long term (current) use of opiate analgesic: Secondary | ICD-10-CM | POA: Insufficient documentation

## 2022-05-19 DIAGNOSIS — G8929 Other chronic pain: Secondary | ICD-10-CM | POA: Diagnosis not present

## 2022-05-19 DIAGNOSIS — M961 Postlaminectomy syndrome, not elsewhere classified: Secondary | ICD-10-CM | POA: Diagnosis not present

## 2022-05-19 DIAGNOSIS — M546 Pain in thoracic spine: Secondary | ICD-10-CM | POA: Diagnosis not present

## 2022-05-19 DIAGNOSIS — Z5181 Encounter for therapeutic drug level monitoring: Secondary | ICD-10-CM | POA: Diagnosis not present

## 2022-05-19 DIAGNOSIS — M5416 Radiculopathy, lumbar region: Secondary | ICD-10-CM | POA: Insufficient documentation

## 2022-05-19 MED ORDER — HYDROCODONE-ACETAMINOPHEN 7.5-325 MG PO TABS: 1.0000 | ORAL_TABLET | Freq: Four times a day (QID) | ORAL | 0 refills | Status: DC | PRN

## 2022-05-19 NOTE — Progress Notes (Unsigned)
Subjective:    Patient ID: Sean [name], male    DOB: Mar 17, 1946, 76 y.o.   MRN: [id number]  HPI: Sean [name] is a 76 y.o. male who returns for follow up appointment for chronic pain and medication refill. states *** pain is located in  ***. rates pain ***. current exercise regime is walking and performing stretching exercises.  Mr. Sandiford Morphine equivalent is *** MME.        Pain Inventory Average Pain 5 Pain Right Now 7 My pain is constant, dull, and tingling  In the last 24 hours, has pain interfered with the following? General activity 6 Relation with others 5 Enjoyment of life 6 What TIME of day is your pain at its worst? night Sleep (in general) Poor  Pain is worse with: sitting and standing Pain improves with: heat/ice and TENS Relief from Meds: 9  History reviewed. No pertinent family history. Social History   Socioeconomic History   Marital status: Married    Spouse name: Not on file   Number of children: Not on file   Years of education: Not on file   Highest education level: Not on file  Occupational History   Not on file  Tobacco Use   Smoking status: Former    Types: Cigarettes   Smokeless tobacco: Never  Vaping Use   Vaping Use: Never used  Substance and Sexual Activity   Alcohol use: Not Currently   Drug use: Never   Sexual activity: Not on file  Other Topics Concern   Not on file  Social History Narrative   Not on file   Social Determinants of Health   Financial Resource Strain: Not on file  Food Insecurity: Not on file  Transportation Needs: Not on file  Physical Activity: Not on file  Stress: Not on file  Social Connections: Not on file   Past Surgical History:  Procedure Laterality Date   [place]     Spinal Cord Stimulator   THYROIDECTOMY     Past Surgical History:  Procedure Laterality Date   BACK SURGERY      BRAIN SURGERY     CHOLECYSTECTOMY     KNEE ARTHROSCOPY     NECK SURGERY     SPINE SURGERY     Spinal Cord Stimulator   THYROIDECTOMY     Past Medical History:  Diagnosis Date   Diabetes ([place])    Hypercholesteremia    Hypertension    Kidney stone    Peptic ulcer disease    BP 110/73   Pulse 79   Ht '5\' 7"'$  (1.702 m)   Wt 184 lb (83.5 kg)   BMI 28.82 kg/m   Opioid Risk Score:   Fall Risk Score:  `1  Depression screen [facility] 2/9     03/19/2022   12:54 PM 01/20/2022   12:58 PM 11/20/2021   12:56 PM 09/09/2021   12:53 PM 07/01/2021    1:05 PM 03/06/2021   12:56 PM 01/09/2021    1:10 PM  Depression screen PHQ 2/9  Decreased Interest 0 0 0 0 0 0 0  Down, Depressed, Hopeless 0 0 0 0 0 0 0  PHQ - 2 Score 0 0 0 0 0 0 0    Review of Systems  Gastrointestinal:  Positive for abdominal pain.  Musculoskeletal:  Positive for back pain.       Left leg pain up to left hip  All other systems reviewed and are negative.      Objective:   Physical Exam        Assessment & Plan:  1. Lumbar Post Laminectomy Syndrome:Chronic Thoracic Back Pain and  Bilateral Lower  Back Pain:  Continue HEP as Tolerated. Continue to Monitor. 03/19/2022. 2. Left Lumbar Radiculitis: Continue current medication regimen. Continue to Monitor. 01/20/2022 3. Chronic Pain Syndrome: Refilled:  Hydrocodone 7.5/325 mg one tablet every 6 hours as needed for moderate pain #135 Second script sent for the following month. 06/22/20223 We will continue the opioid monitoring program, this consists of regular clinic visits, examinations, urine drug screen, pill counts as well as use of [place] Controlled Substance Reporting system. A 12 month History has been reviewed on the [place] Controlled Substance Reporting System on 03/19/2022    F/U in 2 months

## 2022-05-22 LAB — DRUG TOX MONITOR 1 W/CONF, ORAL FLD
Amphetamines: NEGATIVE ng/mL (ref <10)
Barbiturates: NEGATIVE ng/mL (ref <10)
Benzodiazepines: NEGATIVE ng/mL (ref <0.50)
Buprenorphine: NEGATIVE ng/mL (ref <0.10)
Cocaine: NEGATIVE ng/mL (ref <5.0)
Codeine: NEGATIVE ng/mL (ref <2.5)
Dihydrocodeine: 7.6 ng/mL — ABNORMAL HIGH (ref <2.5)
Fentanyl: NEGATIVE ng/mL (ref <0.10)
Heroin Metabolite: NEGATIVE ng/mL (ref <1.0)
Hydrocodone: 55.5 ng/mL — ABNORMAL HIGH (ref <2.5)
Hydromorphone: NEGATIVE ng/mL (ref <2.5)
MARIJUANA: NEGATIVE ng/mL (ref <2.5)
MDMA: NEGATIVE ng/mL (ref <10)
Meprobamate: NEGATIVE ng/mL (ref <2.5)
Methadone: NEGATIVE ng/mL (ref <5.0)
Morphine: NEGATIVE ng/mL (ref <2.5)
Nicotine Metabolite: NEGATIVE ng/mL (ref <5.0)
Norhydrocodone: NEGATIVE ng/mL (ref <2.5)
Noroxycodone: NEGATIVE ng/mL (ref <2.5)
Opiates: POSITIVE ng/mL — AB (ref <2.5)
Oxycodone: NEGATIVE ng/mL (ref <2.5)
Oxymorphone: NEGATIVE ng/mL (ref <2.5)
Phencyclidine: NEGATIVE ng/mL (ref <10)
Tapentadol: NEGATIVE ng/mL (ref <5.0)
Tramadol: NEGATIVE ng/mL (ref <5.0)
Zolpidem: NEGATIVE ng/mL (ref <5.0)

## 2022-05-22 LAB — DRUG TOX ALC METAB W/CON, ORAL FLD: Alcohol Metabolite: NEGATIVE ng/mL (ref <25)

## 2022-05-25 ENCOUNTER — Telehealth: Payer: Self-pay | Admitting: *Deleted

## 2022-05-25 NOTE — Telephone Encounter (Signed)
Oral swab drug screen was consistent for prescribed medications.  ?

## 2022-07-14 ENCOUNTER — Encounter: Payer: Medicare Other | Attending: Physical Medicine & Rehabilitation | Admitting: Registered Nurse

## 2022-07-14 ENCOUNTER — Encounter: Payer: Self-pay | Admitting: Registered Nurse

## 2022-07-14 VITALS — BP 117/80 | HR 87

## 2022-07-14 DIAGNOSIS — Z5181 Encounter for therapeutic drug level monitoring: Secondary | ICD-10-CM | POA: Insufficient documentation

## 2022-07-14 DIAGNOSIS — M546 Pain in thoracic spine: Secondary | ICD-10-CM | POA: Diagnosis not present

## 2022-07-14 DIAGNOSIS — G894 Chronic pain syndrome: Secondary | ICD-10-CM | POA: Diagnosis not present

## 2022-07-14 DIAGNOSIS — G8929 Other chronic pain: Secondary | ICD-10-CM | POA: Insufficient documentation

## 2022-07-14 DIAGNOSIS — M5416 Radiculopathy, lumbar region: Secondary | ICD-10-CM | POA: Diagnosis not present

## 2022-07-14 DIAGNOSIS — M961 Postlaminectomy syndrome, not elsewhere classified: Secondary | ICD-10-CM | POA: Insufficient documentation

## 2022-07-14 DIAGNOSIS — Z79891 Long term (current) use of opiate analgesic: Secondary | ICD-10-CM | POA: Insufficient documentation

## 2022-07-14 MED ORDER — HYDROCODONE-ACETAMINOPHEN 7.5-325 MG PO TABS: 1.0000 | ORAL_TABLET | Freq: Four times a day (QID) | ORAL | 0 refills | Status: DC | PRN

## 2022-07-14 NOTE — Progress Notes (Signed)
Subjective:    Patient ID: Sean [name], male    DOB: Jun 03, 1946, 76 y.o.   MRN: [id number]  HPI: Sean [name] is a 76 y.o. male who returns for follow up appointment for chronic pain and medication refill. He states his pain is located in his mid- back. He rates his pain 10. His current exercise regime is walking and performing stretching exercises.  Sean [name] Morphine equivalent is 33.75 MME.   Last Oral Swab was Performed on 05/19/2022, it was consistent.    Pain Inventory Average Pain 7 Pain Right Now 10 My pain is constant and aching  In the last 24 hours, has pain interfered with the following? General activity 5 Relation with others 5 Enjoyment of life 5 What TIME of day is your pain at its worst? night Sleep (in general) Poor  Pain is worse with: sitting and standing Pain improves with: heat/ice, medication, and TENS Relief from Meds: 7  No family history on file. Social History   Socioeconomic History   Marital status: Married    Spouse name: Not on file   Number of children: Not on file   Years of education: Not on file   Highest education level: Not on file  Occupational History   Not on file  Tobacco Use   Smoking status: Former    Types: Cigarettes   Smokeless tobacco: Never  Vaping Use   Vaping Use: Never used  Substance and Sexual Activity   Alcohol use: Not Currently   Drug use: Never   Sexual activity: Not on file  Other Topics Concern   Not on file  Social History Narrative   Not on file   Social Determinants of Health   Financial Resource Strain: Not on file  Food Insecurity: Not on file  Transportation Needs: Not on file  Physical Activity: Not on file  Stress: Not on file  Social Connections: Not on file   Past Surgical History:  Procedure Laterality Date   [place]     Spinal Cord Stimulator   THYROIDECTOMY     Past  Surgical History:  Procedure Laterality Date   BACK SURGERY     BRAIN SURGERY     CHOLECYSTECTOMY     KNEE ARTHROSCOPY     NECK SURGERY     SPINE SURGERY     Spinal Cord Stimulator   THYROIDECTOMY     Past Medical History:  Diagnosis Date   Diabetes ([place])    Hypercholesteremia    Hypertension    Kidney stone    Peptic ulcer disease    BP 117/80   Pulse 87   SpO2 94%   Opioid Risk Score:   Fall Risk Score:  `1  Depression screen [facility] 2/9     05/19/2022   12:59 PM 03/19/2022   12:54 PM 01/20/2022   12:58 PM 11/20/2021   12:56 PM 09/09/2021   12:53 PM 07/01/2021    1:05 PM 03/06/2021   12:56 PM  Depression screen PHQ 2/9  Decreased Interest 0 0 0 0 0 0 0  Down, Depressed, Hopeless 0 0 0 0 0 0 0  PHQ - 2 Score 0 0 0 0 0 0 0      Review of Systems     Objective:   Physical Exam Vitals and nursing  note reviewed.  Constitutional:      Appearance: Normal appearance.  Cardiovascular:     Rate and Rhythm: Normal rate and regular rhythm.     Pulses: Normal pulses.     Heart sounds: Normal heart sounds.  Pulmonary:     Effort: Pulmonary effort is normal.     Breath sounds: Normal breath sounds.  Musculoskeletal:     Cervical back: Normal range of motion and neck supple.     Comments: Normal Muscle Bulk and Muscle Testing Reveals:  Upper Extremities: Full ROM and Muscle Strength 5/5 Thoracic Paraspinal Tenderness: T-10- T-12 Lower Extremities: Full ROM and Muscle  Strength 5/5 Arises from Chair with Ease Narrow Based Gait     Skin:    General: Skin is warm and dry.  Neurological:     Mental Status: He is alert and oriented to person, place, and time.  Psychiatric:        Mood and Affect: Mood normal.        Behavior: Behavior normal.         Assessment & Plan:  1. Lumbar Post Laminectomy Syndrome:Chronic Thoracic Back Pain and  Bilateral Lower  Back Pain:  Continue HEP as Tolerated. Continue to Monitor. 07/14/2022. 2. Left Lumbar Radiculitis: Continue  current medication regimen. Continue to Monitor. 07/14/2022 3. Chronic Pain Syndrome: Refilled:  Hydrocodone 7.5/325 mg one tablet every 6 hours as needed for moderate pain #135 Second script sent for the following month. 10/17/20223 We will continue the opioid monitoring program, this consists of regular clinic visits, examinations, urine drug screen, pill counts as well as use of [place] Controlled Substance Reporting system. A 12 month History has been reviewed on the [place] on 07/14/2022    F/U in 2 months

## 2022-09-15 ENCOUNTER — Encounter: Payer: Self-pay | Admitting: Registered Nurse

## 2022-09-15 ENCOUNTER — Encounter: Payer: Medicare Other | Attending: Physical Medicine & Rehabilitation | Admitting: Registered Nurse

## 2022-09-15 VITALS — BP 110/80

## 2022-09-15 DIAGNOSIS — Z5181 Encounter for therapeutic drug level monitoring: Secondary | ICD-10-CM | POA: Diagnosis not present

## 2022-09-15 DIAGNOSIS — G8929 Other chronic pain: Secondary | ICD-10-CM | POA: Diagnosis not present

## 2022-09-15 DIAGNOSIS — M961 Postlaminectomy syndrome, not elsewhere classified: Secondary | ICD-10-CM | POA: Diagnosis not present

## 2022-09-15 DIAGNOSIS — Z79891 Long term (current) use of opiate analgesic: Secondary | ICD-10-CM | POA: Insufficient documentation

## 2022-09-15 DIAGNOSIS — M546 Pain in thoracic spine: Secondary | ICD-10-CM | POA: Insufficient documentation

## 2022-09-15 DIAGNOSIS — M5416 Radiculopathy, lumbar region: Secondary | ICD-10-CM

## 2022-09-15 DIAGNOSIS — G894 Chronic pain syndrome: Secondary | ICD-10-CM

## 2022-09-15 MED ORDER — HYDROCODONE-ACETAMINOPHEN 7.5-325 MG PO TABS: 1.0000 | ORAL_TABLET | Freq: Four times a day (QID) | ORAL | 0 refills | Status: DC | PRN

## 2022-09-15 NOTE — Progress Notes (Signed)
Subjective:    Patient ID: Sean [name], male    DOB: Oct 15, 1945, 76 y.o.   MRN: [id number]  HPI: Sean [name] is a 76 y.o. male who is scheduled for My- Chart Visit, he was unable to access the My- Chart Visit, visit changed to telephone visit.  : We have  discussed the limitations of evaluation and management by telemedicine and the availability of in person appointments. The patient expressed understanding and agreed to proceed. He states his pain is located in his mid-lower back radiating into his left lower extremity. He rates his pain 5. His current exercise regime is walking and performing stretching exercises.  [name] Morphine equivalent is 33.75 MME.   Last Oral Swab was Performed on 05/19/2022, it was consistent.    Pain Inventory Average Pain 8 Pain Right Now 5 My pain is constant, dull, and aching  In the last 24 hours, has pain interfered with the following? General activity 5 Relation with others 0 Enjoyment of life 5 What TIME of day is your pain at its worst? night Sleep (in general) Poor  Pain is worse with: bending, standing, and some activites Pain improves with: medication Relief from Meds: 5  History reviewed. No pertinent family history. Social History   Socioeconomic History   Marital status: Married    Spouse name: Not on file   Number of children: Not on file   Years of education: Not on file   Highest education level: Not on file  Occupational History   Not on file  Tobacco Use   Smoking status: Former    Types: Cigarettes   Smokeless tobacco: Never  Vaping Use   Vaping Use: Never used  Substance and Sexual Activity   Alcohol use: Not Currently   Drug use: Never   Sexual activity: Not on file  Other Topics Concern   Not on file  Social History Narrative   Not on file   Social Determinants of Health   Financial Resource Strain: Not on file  Food Insecurity: Not on file  Transportation Needs: Not on file  Physical Activity:  Not on file  Stress: Not on file  Social Connections: Not on file   Past Surgical History:  Procedure Laterality Date   [place]     Spinal Cord Stimulator   THYROIDECTOMY     Past Surgical History:  Procedure Laterality Date   BACK SURGERY     BRAIN SURGERY     CHOLECYSTECTOMY     KNEE ARTHROSCOPY     NECK SURGERY     SPINE SURGERY     Spinal Cord Stimulator   THYROIDECTOMY     Past Medical History:  Diagnosis Date   Diabetes ([place])    Hypercholesteremia    Hypertension    Kidney stone    Peptic ulcer disease    There were no vitals taken for this visit.  Opioid Risk Score:   Fall Risk Score:  `1  Depression screen [facility] 2/9     09/15/2022   12:51 PM 05/19/2022   12:59 PM 03/19/2022   12:54 PM 01/20/2022   12:58 PM 11/20/2021   12:56 PM 09/09/2021   12:53 PM 07/01/2021    1:05 PM  Depression screen PHQ 2/9  Decreased Interest 0 0 0 0 0 0 0  Down, Depressed, Hopeless 0 0 0 0 0 0 0  PHQ - 2 Score 0 0 0 0 0 0 0      Review of Systems  Musculoskeletal:  Positive for back pain.       Left Leg pain   All other systems reviewed and are negative.     Objective:   Physical Exam Vitals and nursing note reviewed.  Musculoskeletal:     Comments: No Physical Exam Performed: Telephone visit         Assessment & Plan:  1. Lumbar Post Laminectomy Syndrome:Chronic Thoracic Back Pain and  Bilateral Lower  Back Pain:  Continue HEP as Tolerated. Continue to Monitor. 09/15/2022. 2. Left Lumbar Radiculitis: Continue current medication regimen. Continue to Monitor. 09/15/2022 3. Chronic Pain Syndrome: Refilled:  Hydrocodone 7.5/325 mg one tablet every 6 hours as needed for moderate pain #135 Second script sent for the following month. 12/19/20223 We will continue the opioid monitoring program, this consists of regular clinic visits, examinations, urine drug screen, pill  counts as well as use of [place] Controlled Substance Reporting system. A 12 month History has been reviewed on the [place] on 09/15/2022    F/U in 2 months  Telephone visit Established Patient Location of Patient: In his home Location of Provider: In the office

## 2022-09-22 ENCOUNTER — Encounter: Payer: Self-pay | Admitting: Registered Nurse

## 2022-10-27 ENCOUNTER — Encounter: Payer: Medicare Other | Attending: Physical Medicine & Rehabilitation | Admitting: Registered Nurse

## 2022-10-27 ENCOUNTER — Encounter: Payer: Self-pay | Admitting: Registered Nurse

## 2022-10-27 ENCOUNTER — Ambulatory Visit: Payer: Medicare Other | Admitting: Registered Nurse

## 2022-10-27 VITALS — BP 109/75 | HR 82 | Ht 67.0 in | Wt 183.0 lb

## 2022-10-27 DIAGNOSIS — Z5181 Encounter for therapeutic drug level monitoring: Secondary | ICD-10-CM | POA: Diagnosis not present

## 2022-10-27 DIAGNOSIS — M545 Low back pain, unspecified: Secondary | ICD-10-CM

## 2022-10-27 DIAGNOSIS — G8929 Other chronic pain: Secondary | ICD-10-CM | POA: Diagnosis not present

## 2022-10-27 DIAGNOSIS — Z79891 Long term (current) use of opiate analgesic: Secondary | ICD-10-CM | POA: Insufficient documentation

## 2022-10-27 DIAGNOSIS — G894 Chronic pain syndrome: Secondary | ICD-10-CM | POA: Insufficient documentation

## 2022-10-27 DIAGNOSIS — M961 Postlaminectomy syndrome, not elsewhere classified: Secondary | ICD-10-CM | POA: Diagnosis not present

## 2022-10-27 DIAGNOSIS — M546 Pain in thoracic spine: Secondary | ICD-10-CM | POA: Diagnosis not present

## 2022-10-27 MED ORDER — HYDROCODONE-ACETAMINOPHEN 7.5-325 MG PO TABS: 1.0000 | ORAL_TABLET | Freq: Four times a day (QID) | ORAL | 0 refills | Status: DC | PRN

## 2022-10-27 NOTE — Progress Notes (Signed)
Subjective:    Patient ID: Sean [name], male    DOB: January 02, 1946, 77 y.o.   MRN: [id number]  HPI: Sean [name] is a 77 y.o. male who returns for follow up appointment for chronic pain and medication refill. He states his pain is located in his lower back. He. rates his pain 5. His current exercise regime is walking and performing stretching exercises.  Ms. Easler Morphine equivalent is 33.75 MME.   Oral Swab was Performed today.    Pain Inventory Average Pain 5 Pain Right Now 5 My pain is constant and stabbing  In the last 24 hours, has pain interfered with the following? General activity 5 Relation with others 5 Enjoyment of life 5 What TIME of day is your pain at its worst? night Sleep (in general) Poor  Pain is worse with: sitting and some activites Pain improves with: medication and TENS Relief from Meds: 5  No family history on file. Social History   Socioeconomic History   Marital status: Married    Spouse name: Not on file   Number of children: Not on file   Years of education: Not on file   Highest education level: Not on file  Occupational History   Not on file  Tobacco Use   Smoking status: Former    Types: Cigarettes   Smokeless tobacco: Never  Vaping Use   Vaping Use: Never used  Substance and Sexual Activity   Alcohol use: Not Currently   Drug use: Never   Sexual activity: Not on file  Other Topics Concern   Not on file  Social History Narrative   Not on file   Social Determinants of Health   Financial Resource Strain: Not on file  Food Insecurity: Not on file  Transportation Needs: Not on file  Physical Activity: Not on file  Stress: Not on file  Social Connections: Not on file   Past Surgical History:  Procedure Laterality Date   [place]     Spinal Cord Stimulator   THYROIDECTOMY     Past Surgical History:  Procedure  Laterality Date   BACK SURGERY     BRAIN SURGERY     CHOLECYSTECTOMY     KNEE ARTHROSCOPY     NECK SURGERY     SPINE SURGERY     Spinal Cord Stimulator   THYROIDECTOMY     Past Medical History:  Diagnosis Date   Diabetes ([place])    Hypercholesteremia    Hypertension    Kidney stone    Peptic ulcer disease    BP 109/75   Pulse 82   Ht '5\' 7"'$  (1.702 m)   Wt 183 lb (83 kg)   SpO2 96%   BMI 28.66 kg/m   Opioid Risk Score:   Fall Risk Score:  `1  Depression screen [facility] 2/9     09/15/2022   12:51 PM 05/19/2022   12:59 PM 03/19/2022   12:54 PM 01/20/2022   12:58 PM 11/20/2021   12:56 PM 09/09/2021   12:53 PM 07/01/2021    1:05 PM  Depression screen PHQ 2/9  Decreased Interest 0 0 0 0 0 0 0  Down, Depressed, Hopeless 0 0 0 0 0 0 0  PHQ - 2 Score 0 0 0 0 0 0 0      Review  of Systems  Musculoskeletal:  Positive for back pain.       PAIN IN THE LEFT LEG  All other systems reviewed and are negative.      Objective:   Physical Exam Vitals and nursing note reviewed.  Constitutional:      Appearance: Normal appearance.  Cardiovascular:     Rate and Rhythm: Normal rate and regular rhythm.     Pulses: Normal pulses.     Heart sounds: Normal heart sounds.  Pulmonary:     Effort: Pulmonary effort is normal.     Breath sounds: Normal breath sounds.  Musculoskeletal:     Cervical back: Normal range of motion and neck supple.     Comments: Normal Muscle Bulk and Muscle Testing Reveals:  Upper Extremities: Full ROM and Muscle Strength 5/5  Lumbar Paraspinal Tenderness: L-1-L-2 Lower Extremities: Full ROM and Muscle Strength 5/5 Arises from Table with ease Narrow Based Gait     Skin:    General: Skin is warm and dry.  Neurological:     Mental Status: He is alert and oriented to person, place, and time.  Psychiatric:        Mood and Affect: Mood normal.        Behavior: Behavior normal.         Assessment & Plan:  1. Lumbar Post Laminectomy Syndrome:Chronic  Thoracic Back Pain and  Bilateral Lower  Back Pain:  Continue HEP as Tolerated. Continue to Monitor. 10/27/2022. 2. Left Lumbar Radiculitis: Continue current medication regimen. Continue to Monitor. 10/27/2022 3. Chronic Pain Syndrome: Refilled:  Hydrocodone 7.5/325 mg one tablet every 6 hours as needed for moderate pain #135 Second script sent for the following month. 10/27/2022 We will continue the opioid monitoring program, this consists of regular clinic visits, examinations, urine drug screen, pill counts as well as use of [place] Controlled Substance Reporting system. A 12 month History has been reviewed on the [place] Controlled Substance Reporting System on 10/27/2022    F/U in 2 months

## 2022-10-29 DIAGNOSIS — M545 Low back pain, unspecified: Secondary | ICD-10-CM | POA: Diagnosis not present

## 2022-10-29 DIAGNOSIS — M546 Pain in thoracic spine: Secondary | ICD-10-CM | POA: Diagnosis not present

## 2022-10-30 LAB — DRUG TOX MONITOR 1 W/CONF, ORAL FLD
Amphetamines: NEGATIVE ng/mL (ref <10)
Barbiturates: NEGATIVE ng/mL (ref <10)
Benzodiazepines: NEGATIVE ng/mL (ref <0.50)
Buprenorphine: NEGATIVE ng/mL (ref <0.10)
Cocaine: NEGATIVE ng/mL (ref <5.0)
Codeine: NEGATIVE ng/mL (ref <2.5)
Dihydrocodeine: 24.9 ng/mL — ABNORMAL HIGH (ref <2.5)
Fentanyl: NEGATIVE ng/mL (ref <0.10)
Heroin Metabolite: NEGATIVE ng/mL (ref <1.0)
Hydrocodone: 202.1 ng/mL — ABNORMAL HIGH (ref <2.5)
Hydromorphone: NEGATIVE ng/mL (ref <2.5)
MARIJUANA: NEGATIVE ng/mL (ref <2.5)
MDMA: NEGATIVE ng/mL (ref <10)
Meprobamate: NEGATIVE ng/mL (ref <2.5)
Methadone: NEGATIVE ng/mL (ref <5.0)
Morphine: NEGATIVE ng/mL (ref <2.5)
Nicotine Metabolite: NEGATIVE ng/mL (ref <5.0)
Norhydrocodone: 3.9 ng/mL — ABNORMAL HIGH (ref <2.5)
Noroxycodone: NEGATIVE ng/mL (ref <2.5)
Opiates: POSITIVE ng/mL — AB (ref <2.5)
Oxycodone: NEGATIVE ng/mL (ref <2.5)
Oxymorphone: NEGATIVE ng/mL (ref <2.5)
Phencyclidine: NEGATIVE ng/mL (ref <10)
Tapentadol: NEGATIVE ng/mL (ref <5.0)
Tramadol: NEGATIVE ng/mL (ref <5.0)
Zolpidem: NEGATIVE ng/mL (ref <5.0)

## 2022-10-30 LAB — DRUG TOX ALC METAB W/CON, ORAL FLD: Alcohol Metabolite: NEGATIVE ng/mL (ref <25)

## 2022-11-09 DIAGNOSIS — M545 Low back pain, unspecified: Secondary | ICD-10-CM | POA: Diagnosis not present

## 2022-11-09 DIAGNOSIS — M546 Pain in thoracic spine: Secondary | ICD-10-CM | POA: Diagnosis not present

## 2022-11-10 ENCOUNTER — Ambulatory Visit: Payer: Medicare Other | Admitting: Registered Nurse

## 2022-11-12 DIAGNOSIS — M546 Pain in thoracic spine: Secondary | ICD-10-CM | POA: Diagnosis not present

## 2022-11-12 DIAGNOSIS — M545 Low back pain, unspecified: Secondary | ICD-10-CM | POA: Diagnosis not present

## 2022-11-17 DIAGNOSIS — E785 Hyperlipidemia, unspecified: Secondary | ICD-10-CM | POA: Diagnosis not present

## 2022-11-17 DIAGNOSIS — K219 Gastro-esophageal reflux disease without esophagitis: Secondary | ICD-10-CM | POA: Diagnosis not present

## 2022-11-17 DIAGNOSIS — E1165 Type 2 diabetes mellitus with hyperglycemia: Secondary | ICD-10-CM | POA: Diagnosis not present

## 2022-11-17 DIAGNOSIS — E063 Autoimmune thyroiditis: Secondary | ICD-10-CM | POA: Diagnosis not present

## 2022-11-17 DIAGNOSIS — K529 Noninfective gastroenteritis and colitis, unspecified: Secondary | ICD-10-CM | POA: Diagnosis not present

## 2022-11-17 DIAGNOSIS — I10 Essential (primary) hypertension: Secondary | ICD-10-CM | POA: Diagnosis not present

## 2022-12-08 ENCOUNTER — Encounter: Payer: Medicare Other | Attending: Physical Medicine & Rehabilitation | Admitting: Registered Nurse

## 2022-12-08 ENCOUNTER — Encounter: Payer: Self-pay | Admitting: Registered Nurse

## 2022-12-08 VITALS — BP 112/76 | HR 80 | Ht 67.0 in | Wt 176.6 lb

## 2022-12-08 DIAGNOSIS — M546 Pain in thoracic spine: Secondary | ICD-10-CM | POA: Diagnosis not present

## 2022-12-08 DIAGNOSIS — G894 Chronic pain syndrome: Secondary | ICD-10-CM | POA: Insufficient documentation

## 2022-12-08 DIAGNOSIS — M961 Postlaminectomy syndrome, not elsewhere classified: Secondary | ICD-10-CM | POA: Diagnosis not present

## 2022-12-08 DIAGNOSIS — G8929 Other chronic pain: Secondary | ICD-10-CM | POA: Insufficient documentation

## 2022-12-08 DIAGNOSIS — Z79891 Long term (current) use of opiate analgesic: Secondary | ICD-10-CM | POA: Insufficient documentation

## 2022-12-08 DIAGNOSIS — Z5181 Encounter for therapeutic drug level monitoring: Secondary | ICD-10-CM | POA: Insufficient documentation

## 2022-12-08 MED ORDER — HYDROCODONE-ACETAMINOPHEN 7.5-325 MG PO TABS: 1.0000 | ORAL_TABLET | Freq: Four times a day (QID) | ORAL | 0 refills | Status: DC | PRN

## 2022-12-08 NOTE — Progress Notes (Signed)
Subjective:    Patient ID: Sean [name], male    DOB: 1946/07/11, 77 y.o.   MRN: [id number]  HPI: Sean [name] is a 77 y.o. male who returns for follow up appointment for chronic pain and medication refill. He states his pain is located in his mid- back pain. He rates his pain 5. His current exercise regime is walking and performing stretching exercises.  Sean [name] Morphine equivalent is 33.75 MME.   Last Oral Swab was Performed on 10/27/2022, it was consistent.     Pain Inventory Average Pain 5 Pain Right Now 5 My pain is constant, stabbing, tingling, and aching  In the last 24 hours, has pain interfered with the following? General activity 5 Relation with others 5 Enjoyment of life 5 What TIME of day is your pain at its worst? night Sleep (in general) Poor  Pain is worse with: sitting Pain improves with: heat/ice, medication, and TENS Relief from Meds: 7  No family history on file. Social History   Socioeconomic History   Marital status: Married    Spouse name: Not on file   Number of children: Not on file   Years of education: Not on file   Highest education level: Not on file  Occupational History   Not on file  Tobacco Use   Smoking status: Former    Types: Cigarettes   Smokeless tobacco: Never  Vaping Use   Vaping Use: Never used  Substance and Sexual Activity   Alcohol use: Not Currently   Drug use: Never   Sexual activity: Not on file  Other Topics Concern   Not on file  Social History Narrative   Not on file   Social Determinants of Health   Financial Resource Strain: Not on file  Food Insecurity: Not on file  Transportation Needs: Not on file  Physical Activity: Not on file  Stress: Not on file  Social Connections: Not on file   Past Surgical History:  Procedure Laterality Date   [place] ARTHROSCOPY     NECK SURGERY     SPINE SURGERY     Spinal Cord Stimulator   THYROIDECTOMY      Past Surgical History:  Procedure Laterality Date   BACK SURGERY     BRAIN SURGERY     CHOLECYSTECTOMY     KNEE ARTHROSCOPY     NECK SURGERY     SPINE SURGERY     Spinal Cord Stimulator   THYROIDECTOMY     Past Medical History:  Diagnosis Date   Diabetes ([place])    Hypercholesteremia    Hypertension    Kidney stone    Peptic ulcer disease    BP 112/76   Pulse 80   Ht '5\' 7"'$  (1.702 m)   Wt 176 lb 9.6 oz (80.1 kg)   SpO2 95%   BMI 27.66 kg/m   Opioid Risk Score:   Fall Risk Score:  `1  Depression screen [facility] 2/9     12/08/2022   10:33 AM 10/27/2022   11:26 AM 09/15/2022   12:51 PM 05/19/2022   12:59 PM 03/19/2022   12:54 PM 01/20/2022   12:58 PM 11/20/2021   12:56 PM  Depression screen [facility] 2/9  Decreased Interest 0 0 0 0 0 0 0  Down, Depressed, Hopeless 0 0 0 0 0 0 0  PHQ - 2 Score 0 0 0 0 0  0 0     Review of Systems  Constitutional: Negative.   HENT: Negative.    Eyes: Negative.   Respiratory: Negative.    Cardiovascular: Negative.   Gastrointestinal: Negative.   Endocrine: Negative.   Genitourinary: Negative.   Musculoskeletal:  Positive for back pain.  Skin: Negative.   Allergic/Immunologic: Negative.   Neurological: Negative.   Hematological: Negative.   Psychiatric/Behavioral: Negative.    All other systems reviewed and are negative.      Objective:   Physical Exam Vitals and nursing note reviewed.  Constitutional:      Appearance: Normal appearance.  Cardiovascular:     Rate and Rhythm: Normal rate and regular rhythm.     Pulses: Normal pulses.     Heart sounds: Normal heart sounds.  Pulmonary:     Effort: Pulmonary effort is normal.     Breath sounds: Normal breath sounds.  Musculoskeletal:     Cervical back: Normal range of motion and neck supple.     Comments: Normal Muscle Bulk and Muscle Testing Reveals:  Upper Extremities: Full ROM and Muscle Strength 5/5  Thoracic Paraspinal Tenderness: T-7-T-9 Lower Extremities: Full ROM and  Muscle Strength 5/5 Arises from chair with ease Narrow Based  Gait     Skin:    General: Skin is warm and dry.  Neurological:     Mental Status: He is alert and oriented to person, place, and time.  Psychiatric:        Mood and Affect: Mood normal.        Behavior: Behavior normal.         Assessment & Plan:  1. Lumbar Post Laminectomy Syndrome:Chronic Thoracic Back Pain and  Bilateral Lower  Back Pain:  Continue HEP as Tolerated. Continue to Monitor. 12/08/2022. 2. Left Lumbar Radiculitis:No complaints today.  Continue current medication regimen. Continue to Monitor. 12/08/2022 3. Chronic Pain Syndrome: Refilled:  Hydrocodone 7.5/325 mg one tablet every 6 hours as needed for moderate pain #135 Second script sent for the following month. 12/08/2022 We will continue the opioid monitoring program, this consists of regular clinic visits, examinations, urine drug screen, pill counts as well as use of [place] Controlled Substance Reporting system. A 12 month History has been reviewed on the [place] Controlled Substance Reporting System on 03/ 08/2023   F/U in 2 months

## 2022-12-10 ENCOUNTER — Encounter: Payer: Medicare Other | Admitting: Registered Nurse

## 2023-02-09 ENCOUNTER — Ambulatory Visit: Payer: Medicare Other | Admitting: Registered Nurse

## 2023-02-12 ENCOUNTER — Encounter: Payer: Medicare Other | Attending: Physical Medicine & Rehabilitation | Admitting: Registered Nurse

## 2023-02-12 ENCOUNTER — Encounter: Payer: Self-pay | Admitting: Registered Nurse

## 2023-02-12 VITALS — BP 120/72 | HR 83 | Ht 67.0 in | Wt 186.2 lb

## 2023-02-12 DIAGNOSIS — Z79891 Long term (current) use of opiate analgesic: Secondary | ICD-10-CM | POA: Diagnosis not present

## 2023-02-12 DIAGNOSIS — Z5181 Encounter for therapeutic drug level monitoring: Secondary | ICD-10-CM

## 2023-02-12 DIAGNOSIS — M961 Postlaminectomy syndrome, not elsewhere classified: Secondary | ICD-10-CM

## 2023-02-12 DIAGNOSIS — M546 Pain in thoracic spine: Secondary | ICD-10-CM | POA: Diagnosis not present

## 2023-02-12 DIAGNOSIS — G894 Chronic pain syndrome: Secondary | ICD-10-CM

## 2023-02-12 DIAGNOSIS — G8929 Other chronic pain: Secondary | ICD-10-CM | POA: Diagnosis not present

## 2023-02-12 DIAGNOSIS — K4090 Unilateral inguinal hernia, without obstruction or gangrene, not specified as recurrent: Secondary | ICD-10-CM | POA: Diagnosis not present

## 2023-02-12 MED ORDER — HYDROCODONE-ACETAMINOPHEN 7.5-325 MG PO TABS: 1.0000 | ORAL_TABLET | Freq: Four times a day (QID) | ORAL | 0 refills | Status: DC | PRN

## 2023-02-12 NOTE — Progress Notes (Signed)
Subjective:    Patient ID: Sean [name] Stable, male    DOB: 07-15-1946, 77 y.o.   MRN: [id number]  HPI: Sean [name] is a 77 y.o. male who returns for follow up appointment for chronic pain and medication refill. He states his pain is located in his mid- back .He rates his pain 5. His current exercise regime is walking and performing stretching exercises.  Mr. Quain Morphine equivalent is 33.75 MME.   Oral Swab was Performed today.     Pain Inventory Average Pain 5 Pain Right Now 5 My pain is sharp  In the last 24 hours, has pain interfered with the following? General activity 6 Relation with others 6 Enjoyment of life 6 What TIME of day is your pain at its worst? night Sleep (in general) Poor  Pain is worse with: walking, bending, and sitting Pain improves with: heat/ice, medication, and TENS Relief from Meds: 7  No family history on file. Social History   Socioeconomic History   Marital status: Married    Spouse name: Not on file   Number of children: Not on file   Years of education: Not on file   Highest education level: Not on file  Occupational History   Not on file  Tobacco Use   Smoking status: Former    Types: Cigarettes   Smokeless tobacco: Never  Vaping Use   Vaping Use: Never used  Substance and Sexual Activity   Alcohol use: Not Currently   Drug use: Never   Sexual activity: Not on file  Other Topics Concern   Not on file  Social History Narrative   Not on file   Social Determinants of Health   Financial Resource Strain: Not on file  Food Insecurity: Not on file  Transportation Needs: Not on file  Physical Activity: Not on file  Stress: Not on file  Social Connections: Not on file   Past Surgical History:  Procedure Laterality Date   BACK SURGERY     BRAIN SURGERY     CHOLECYSTECTOMY     KNEE ARTHROSCOPY     NECK SURGERY     SPINE SURGERY     Spinal Cord Stimulator   THYROIDECTOMY     Past Surgical History:  Procedure  Laterality Date   BACK SURGERY     BRAIN SURGERY     CHOLECYSTECTOMY     KNEE ARTHROSCOPY     NECK SURGERY     SPINE SURGERY     Spinal Cord Stimulator   THYROIDECTOMY     Past Medical History:  Diagnosis Date   Diabetes ([facility])    Hypercholesteremia    Hypertension    Kidney stone    Peptic ulcer disease    There were no vitals taken for this visit.  Opioid Risk Score:   Fall Risk Score:  `1  Depression screen [facility] 2/9     12/08/2022   10:33 AM 10/27/2022   11:26 AM 09/15/2022   12:51 PM 05/19/2022   12:59 PM 03/19/2022   12:54 PM 01/20/2022   12:58 PM 11/20/2021   12:56 PM  Depression screen PHQ 2/9  Decreased Interest 0 0 0 0 0 0 0  Down, Depressed, Hopeless 0 0 0 0 0 0 0  PHQ - 2 Score 0 0 0 0 0 0 0     Review of Systems  Constitutional: Negative.   HENT: Negative.    Eyes: Negative.   Respiratory: Negative.    Cardiovascular: Negative.  Gastrointestinal: Negative.   Endocrine: Negative.   Genitourinary: Negative.   Musculoskeletal:  Positive for back pain.  Skin: Negative.   Allergic/Immunologic: Negative.   Neurological: Negative.   Hematological: Negative.   Psychiatric/Behavioral: Negative.    All other systems reviewed and are negative.      Objective:   Physical Exam Vitals and nursing note reviewed.  Constitutional:      Appearance: Normal appearance.  Cardiovascular:     Rate and Rhythm: Normal rate and regular rhythm.     Pulses: Normal pulses.     Heart sounds: Normal heart sounds.  Pulmonary:     Effort: Pulmonary effort is normal.     Breath sounds: Normal breath sounds.  Musculoskeletal:     Cervical back: Normal range of motion and neck supple.     Comments: Normal Muscle Bulk and Muscle Testing Reveals:  Upper Extremities: Full ROM and Muscle Strength 5/5  Thoracic Paraspinal Tenderness: T-7-T-9 Lower Extremities: Full ROM and Muscle Strength 5/5 Arises from Table with ease Narrow Based  Gait     Skin:    General: Skin is  warm and dry.  Neurological:     Mental Status: He is alert and oriented to person, place, and time.  Psychiatric:        Mood and Affect: Mood normal.        Behavior: Behavior normal.         Assessment & Plan:  1. Lumbar Post Laminectomy Syndrome:Chronic Thoracic Back Pain and  Bilateral Lower  Back Pain:  Continue HEP as Tolerated. Continue to Monitor. 02/12/2023. 2. Left Lumbar Radiculitis:No complaints today.  Continue current medication regimen. Continue to Monitor. 02/12/2023 3. Chronic Pain Syndrome: Refilled:  Hydrocodone 7.5/325 mg one tablet every 6 hours as needed for moderate pain #135 Second script sent for the following month. 12/08/2022 We will continue the opioid monitoring program, this consists of regular clinic visits, examinations, urine drug screen, pill counts as well as use of [place] Controlled Substance Reporting system. A 12 month History has been reviewed on the [place] Controlled Substance Reporting System on 05/ 17/2024    F/U in 2 months

## 2023-02-16 LAB — DRUG TOX MONITOR 1 W/CONF, ORAL FLD
Amphetamines: NEGATIVE ng/mL (ref <10)
Barbiturates: NEGATIVE ng/mL (ref <10)
Benzodiazepines: NEGATIVE ng/mL (ref <0.50)
Buprenorphine: NEGATIVE ng/mL (ref <0.10)
Cocaine: NEGATIVE ng/mL (ref <5.0)
Codeine: NEGATIVE ng/mL (ref <2.5)
Dihydrocodeine: 15.7 ng/mL — ABNORMAL HIGH (ref <2.5)
Fentanyl: NEGATIVE ng/mL (ref <0.10)
Heroin Metabolite: NEGATIVE ng/mL (ref <1.0)
Hydrocodone: 103.2 ng/mL — ABNORMAL HIGH (ref <2.5)
Hydromorphone: NEGATIVE ng/mL (ref <2.5)
MARIJUANA: NEGATIVE ng/mL (ref <2.5)
MDMA: NEGATIVE ng/mL (ref <10)
Meprobamate: NEGATIVE ng/mL (ref <2.5)
Methadone: NEGATIVE ng/mL (ref <5.0)
Morphine: NEGATIVE ng/mL (ref <2.5)
Nicotine Metabolite: NEGATIVE ng/mL (ref <5.0)
Norhydrocodone: 2.5 ng/mL — ABNORMAL HIGH (ref <2.5)
Noroxycodone: NEGATIVE ng/mL (ref <2.5)
Opiates: POSITIVE ng/mL — AB (ref <2.5)
Oxycodone: NEGATIVE ng/mL (ref <2.5)
Oxymorphone: NEGATIVE ng/mL (ref <2.5)
Phencyclidine: NEGATIVE ng/mL (ref <10)
Tapentadol: NEGATIVE ng/mL (ref <5.0)
Tramadol: NEGATIVE ng/mL (ref <5.0)
Zolpidem: NEGATIVE ng/mL (ref <5.0)

## 2023-02-16 LAB — DRUG TOX ALC METAB W/CON, ORAL FLD: Alcohol Metabolite: NEGATIVE ng/mL (ref <25)

## 2023-03-01 DIAGNOSIS — E063 Autoimmune thyroiditis: Secondary | ICD-10-CM | POA: Diagnosis not present

## 2023-03-01 DIAGNOSIS — K219 Gastro-esophageal reflux disease without esophagitis: Secondary | ICD-10-CM | POA: Diagnosis not present

## 2023-03-01 DIAGNOSIS — E1165 Type 2 diabetes mellitus with hyperglycemia: Secondary | ICD-10-CM | POA: Diagnosis not present

## 2023-03-01 DIAGNOSIS — I10 Essential (primary) hypertension: Secondary | ICD-10-CM | POA: Diagnosis not present

## 2023-03-01 DIAGNOSIS — K4020 Bilateral inguinal hernia, without obstruction or gangrene, not specified as recurrent: Secondary | ICD-10-CM | POA: Diagnosis not present

## 2023-03-01 DIAGNOSIS — E785 Hyperlipidemia, unspecified: Secondary | ICD-10-CM | POA: Diagnosis not present

## 2023-03-04 ENCOUNTER — Telehealth: Payer: Self-pay | Admitting: *Deleted

## 2023-03-04 NOTE — Telephone Encounter (Signed)
Oral swab drug screen was consistent for prescribed medications.  ?

## 2023-03-31 DIAGNOSIS — K4020 Bilateral inguinal hernia, without obstruction or gangrene, not specified as recurrent: Secondary | ICD-10-CM | POA: Diagnosis not present

## 2023-03-31 DIAGNOSIS — R1032 Left lower quadrant pain: Secondary | ICD-10-CM | POA: Diagnosis not present

## 2023-04-09 ENCOUNTER — Encounter: Payer: Medicare Other | Attending: Physical Medicine & Rehabilitation | Admitting: Registered Nurse

## 2023-04-09 ENCOUNTER — Encounter: Payer: Self-pay | Admitting: Registered Nurse

## 2023-04-09 VITALS — Ht 67.0 in | Wt 178.0 lb

## 2023-04-09 DIAGNOSIS — M5416 Radiculopathy, lumbar region: Secondary | ICD-10-CM | POA: Diagnosis not present

## 2023-04-09 DIAGNOSIS — Z79891 Long term (current) use of opiate analgesic: Secondary | ICD-10-CM | POA: Insufficient documentation

## 2023-04-09 DIAGNOSIS — G8929 Other chronic pain: Secondary | ICD-10-CM | POA: Insufficient documentation

## 2023-04-09 DIAGNOSIS — M546 Pain in thoracic spine: Secondary | ICD-10-CM | POA: Diagnosis not present

## 2023-04-09 DIAGNOSIS — G894 Chronic pain syndrome: Secondary | ICD-10-CM | POA: Insufficient documentation

## 2023-04-09 DIAGNOSIS — Z5181 Encounter for therapeutic drug level monitoring: Secondary | ICD-10-CM | POA: Diagnosis not present

## 2023-04-09 DIAGNOSIS — M961 Postlaminectomy syndrome, not elsewhere classified: Secondary | ICD-10-CM | POA: Diagnosis not present

## 2023-04-09 MED ORDER — HYDROCODONE-ACETAMINOPHEN 7.5-325 MG PO TABS: 1.0000 | ORAL_TABLET | Freq: Four times a day (QID) | ORAL | 0 refills | Status: DC | PRN

## 2023-04-09 NOTE — Progress Notes (Signed)
Subjective:    Patient ID: Sean [name] Stable, male    DOB: 07/21/1946, 77 y.o.   MRN: [id number]  HPI: Sean [name] is a 77 y.o. male who returns for follow up appointment for chronic pain and medication refill. He  states his pain is located in his mid- lower back radiating into his left lower extremity. He rates his pain 5. His current exercise regime is walking and performing stretching exercises.   [name] Morphine equivalent is 33.75 MME.   Last Oral Swab was Performed on 02/12/2023, it was consistent.   Vitals: 121/80 P 89 95%  Pain Inventory Average Pain 5 Pain Right Now 5 My pain is constant  In the last 24 hours, has pain interfered with the following? General activity 5 Relation with others 5 Enjoyment of life 5 What TIME of day is your pain at its worst? night Sleep (in general) Poor  Pain is worse with: walking, sitting, and standing Pain improves with: medication Relief from Meds: 5  No family history on file. Social History   Socioeconomic History   Marital status: Married    Spouse name: Not on file   Number of children: Not on file   Years of education: Not on file   Highest education level: Not on file  Occupational History   Not on file  Tobacco Use   Smoking status: Former    Types: Cigarettes   Smokeless tobacco: Never  Vaping Use   Vaping status: Never Used  Substance and Sexual Activity   Alcohol use: Not Currently   Drug use: Never   Sexual activity: Not on file  Other Topics Concern   Not on file  Social History Narrative   Not on file   Social Determinants of Health   Financial Resource Strain: Not on file  Food Insecurity: Not on file  Transportation Needs: Not on file  Physical Activity: Not on file  Stress: Not on file  Social Connections: Not on file   Past Surgical History:  Procedure Laterality Date   BACK SURGERY     BRAIN SURGERY     CHOLECYSTECTOMY     KNEE ARTHROSCOPY     NECK SURGERY     SPINE SURGERY      Spinal Cord Stimulator   THYROIDECTOMY     Past Surgical History:  Procedure Laterality Date   BACK SURGERY     BRAIN SURGERY     CHOLECYSTECTOMY     KNEE ARTHROSCOPY     NECK SURGERY     SPINE SURGERY     Spinal Cord Stimulator   THYROIDECTOMY     Past Medical History:  Diagnosis Date   Diabetes ([facility])    Hypercholesteremia    Hypertension    Kidney stone    Peptic ulcer disease    Ht 5\' 7"  (1.702 m)   Wt 178 lb (80.7 kg)   BMI 27.88 kg/m   Opioid Risk Score:   Fall Risk Score:  `1  Depression screen [facility] 2/9     02/12/2023    9:06 AM 12/08/2022   10:33 AM 10/27/2022   11:26 AM 09/15/2022   12:51 PM 05/19/2022   12:59 PM 03/19/2022   12:54 PM 01/20/2022   12:58 PM  Depression screen [facility] 2/9  Decreased Interest 0 0 0 0 0 0 0  Down, Depressed, Hopeless 0 0 0 0 0 0 0  PHQ - 2 Score 0 0 0 0 0 0 0  Review of Systems  Musculoskeletal:  Positive for back pain.       Left leg pain  All other systems reviewed and are negative.     Objective:   Physical Exam Vitals and nursing note reviewed.  Constitutional:      Appearance: Normal appearance.  Cardiovascular:     Rate and Rhythm: Normal rate and regular rhythm.     Pulses: Normal pulses.     Heart sounds: Normal heart sounds.  Pulmonary:     Effort: Pulmonary effort is normal.     Breath sounds: Normal breath sounds.  Musculoskeletal:     Cervical back: Normal range of motion and neck supple.     Comments: Normal Muscle Bulk and Muscle Testing Reveals:  Upper Extremities: Full ROM and Muscle Strength 5/5  Thoracic Paraspinal Tenderness: T-7-T-9 Lower Extremities: Full ROM and Muscle Strength 5/5 Arises from chair with ease Narrow Based  Gait     Skin:    General: Skin is warm and dry.  Neurological:     Mental Status: He is alert and oriented to person, place, and time.  Psychiatric:        Mood and Affect: Mood normal.        Behavior: Behavior normal.         Assessment & Plan:  1. Lumbar  Post Laminectomy Syndrome:Chronic Thoracic Back Pain and  Bilateral Lower  Back Pain:  Continue HEP as Tolerated. Continue to Monitor. 04/09/2023. 2. Left Lumbar Radiculitis:Continue current medication regimen. Continue to Monitor. 04/09/2023 3. Chronic Pain Syndrome: Refilled:  Hydrocodone 7.5/325 mg one tablet every 6 hours as needed for moderate pain #135 Second script sent for the following month. 04/09/2023 We will continue the opioid monitoring program, this consists of regular clinic visits, examinations, urine drug screen, pill counts as well as use of [place] Controlled Substance Reporting system. A 12 month History has been reviewed on the [place] Controlled Substance Reporting System on 07/ 08/2023    F/U in 2 months

## 2023-06-11 ENCOUNTER — Encounter: Payer: Medicare Other | Attending: Physical Medicine & Rehabilitation | Admitting: Registered Nurse

## 2023-06-11 ENCOUNTER — Encounter: Payer: Self-pay | Admitting: Registered Nurse

## 2023-06-11 VITALS — BP 143/89 | HR 92 | Ht 67.0 in | Wt 181.2 lb

## 2023-06-11 DIAGNOSIS — M546 Pain in thoracic spine: Secondary | ICD-10-CM | POA: Diagnosis not present

## 2023-06-11 DIAGNOSIS — Z5181 Encounter for therapeutic drug level monitoring: Secondary | ICD-10-CM | POA: Diagnosis not present

## 2023-06-11 DIAGNOSIS — G8929 Other chronic pain: Secondary | ICD-10-CM | POA: Insufficient documentation

## 2023-06-11 DIAGNOSIS — Z79891 Long term (current) use of opiate analgesic: Secondary | ICD-10-CM | POA: Insufficient documentation

## 2023-06-11 DIAGNOSIS — M961 Postlaminectomy syndrome, not elsewhere classified: Secondary | ICD-10-CM | POA: Diagnosis not present

## 2023-06-11 DIAGNOSIS — G894 Chronic pain syndrome: Secondary | ICD-10-CM | POA: Insufficient documentation

## 2023-06-11 MED ORDER — HYDROCODONE-ACETAMINOPHEN 7.5-325 MG PO TABS
1.0000 | ORAL_TABLET | Freq: Four times a day (QID) | ORAL | 0 refills | Status: DC | PRN
Start: 2023-06-11 — End: 2023-06-11

## 2023-06-11 MED ORDER — HYDROCODONE-ACETAMINOPHEN 7.5-325 MG PO TABS: 1.0000 | ORAL_TABLET | Freq: Four times a day (QID) | ORAL | 0 refills | Status: DC | PRN

## 2023-06-11 NOTE — Progress Notes (Signed)
Subjective:    Patient ID: Sean [name], male    DOB: 1946-07-13, 77 y.o.   MRN: [id number]  HPI: Sean [name] is a 77 y.o. male who returns for follow up appointment for chronic pain and medication refill. He states his  pain is located in his mid-back. He rates his pain 8. His current exercise regime is walking and performing stretching exercises.  Sean [name] reports he is scheduled for hernia  ( herniorrhaphy)surgery on 06/29/2023,  Dr Cleatrice Burke his next scheduled appointment will be virtual as he requested.   Sean [name] Morphine equivalent is 37.50 MME.   Oral Swab was Performed today.    Pain Inventory Average Pain 6 Pain Right Now 8 My pain is constant and dull  In the last 24 hours, has pain interfered with the following? General activity 4 Relation with others 4 Enjoyment of life 4 What TIME of day is your pain at its worst? night Sleep (in general) Poor  Pain is worse with: walking and sitting Pain improves with: heat/ice and medication Relief from Meds: 6  No family history on file. Social History   Socioeconomic History   Marital status: Married    Spouse name: Not on file   Number of children: Not on file   Years of education: Not on file   Highest education level: Not on file  Occupational History   Not on file  Tobacco Use   Smoking status: Former    Types: Cigarettes   Smokeless tobacco: Never  Vaping Use   Vaping status: Never Used  Substance and Sexual Activity   Alcohol use: Not Currently   Drug use: Never   Sexual activity: Not on file  Other Topics Concern   Not on file  Social History Narrative   Not on file   Social Determinants of Health   Financial Resource Strain: Not on file  Food Insecurity: Not on file  Transportation Needs: Not on file  Physical Activity: Not on file  Stress: Not on file  Social Connections: Not on file   Past Surgical History:  Procedure Laterality Date   BACK SURGERY     BRAIN SURGERY      CHOLECYSTECTOMY     KNEE ARTHROSCOPY     NECK SURGERY     SPINE SURGERY     Spinal Cord Stimulator   THYROIDECTOMY     Past Surgical History:  Procedure Laterality Date   BACK SURGERY     BRAIN SURGERY     CHOLECYSTECTOMY     KNEE ARTHROSCOPY     NECK SURGERY     SPINE SURGERY     Spinal Cord Stimulator   THYROIDECTOMY     Past Medical History:  Diagnosis Date   Diabetes ([facility])    Hypercholesteremia    Hypertension    Kidney stone    Peptic ulcer disease    BP (!) 143/89   Pulse 92   Ht 5\' 7"  (1.702 m)   Wt 181 lb 3.2 oz (82.2 kg)   SpO2 93%   BMI 28.38 kg/m   Opioid Risk Score:   Fall Risk Score:  `1  Depression screen [facility] 2/9     02/12/2023    9:06 AM 12/08/2022   10:33 AM 10/27/2022   11:26 AM 09/15/2022   12:51 PM 05/19/2022   12:59 PM 03/19/2022   12:54 PM 01/20/2022   12:58 PM  Depression screen [facility] 2/9  Decreased Interest 0 0 0 0 0 0  0  Down, Depressed, Hopeless 0 0 0 0 0 0 0  PHQ - 2 Score 0 0 0 0 0 0 0      Review of Systems  Musculoskeletal:  Positive for back pain.       Pelvic area Left thigh pain  All other systems reviewed and are negative.     Objective:   Physical Exam Vitals and nursing note reviewed.  Constitutional:      Appearance: Normal appearance.  Cardiovascular:     Rate and Rhythm: Normal rate and regular rhythm.     Pulses: Normal pulses.     Heart sounds: Normal heart sounds.  Pulmonary:     Effort: Pulmonary effort is normal.     Breath sounds: Normal breath sounds.  Musculoskeletal:     Cervical back: Normal range of motion and neck supple.     Comments: Normal Muscle Bulk and Muscle Testing Reveals:  Upper Extremities: Full ROM and Muscle Strength 5/5  Thoracic Paraspinal Tenderness: T-7-T-9 Lower Extremities: Full ROM and Muscle Strength 5/5 Arises from chair with ease Narrow Based  Gait     Skin:    General: Skin is warm and dry.  Neurological:     Mental Status: He is alert and oriented to person, place,  and time.  Psychiatric:        Mood and Affect: Mood normal.        Behavior: Behavior normal.         Assessment & Plan:  1. Lumbar Post Laminectomy Syndrome:Chronic Thoracic Back Pain and  Bilateral Lower  Back Pain:  Continue HEP as Tolerated. Continue to Monitor. 06/11/2023. 2. Left Lumbar Radiculitis:No complaints today Continue current medication regimen. Continue to Monitor. 06/11/2023 3. Chronic Bilateral Thoracic Back Pain : Continue HEP as tolerated. Continue to Monitor. 06/11/2023 3. Chronic Pain Syndrome: Refilled:  Hydrocodone 7.5/325 mg one tablet every 6 hours as needed for moderate pain #135 Second script sent for the following month. 06/11/2023 We will continue the opioid monitoring program, this consists of regular clinic visits, examinations, urine drug screen, pill counts as well as use of [place] Controlled Substance Reporting system. A 12 month History has been reviewed on the [place] Controlled Substance Reporting System on 09/ 13/2024    F/U in 2 months

## 2023-06-21 DIAGNOSIS — K469 Unspecified abdominal hernia without obstruction or gangrene: Secondary | ICD-10-CM | POA: Diagnosis not present

## 2023-06-21 DIAGNOSIS — Z01818 Encounter for other preprocedural examination: Secondary | ICD-10-CM | POA: Diagnosis not present

## 2023-06-21 DIAGNOSIS — Z01810 Encounter for preprocedural cardiovascular examination: Secondary | ICD-10-CM | POA: Diagnosis not present

## 2023-06-29 DIAGNOSIS — K4020 Bilateral inguinal hernia, without obstruction or gangrene, not specified as recurrent: Secondary | ICD-10-CM | POA: Diagnosis not present

## 2023-08-06 ENCOUNTER — Encounter: Payer: Self-pay | Admitting: Registered Nurse

## 2023-08-06 ENCOUNTER — Encounter: Payer: Medicare Other | Attending: Physical Medicine & Rehabilitation | Admitting: Registered Nurse

## 2023-08-06 VITALS — Ht 67.0 in

## 2023-08-06 DIAGNOSIS — M961 Postlaminectomy syndrome, not elsewhere classified: Secondary | ICD-10-CM | POA: Insufficient documentation

## 2023-08-06 DIAGNOSIS — G894 Chronic pain syndrome: Secondary | ICD-10-CM | POA: Insufficient documentation

## 2023-08-06 DIAGNOSIS — G8929 Other chronic pain: Secondary | ICD-10-CM | POA: Insufficient documentation

## 2023-08-06 DIAGNOSIS — Z79891 Long term (current) use of opiate analgesic: Secondary | ICD-10-CM | POA: Diagnosis not present

## 2023-08-06 DIAGNOSIS — M5416 Radiculopathy, lumbar region: Secondary | ICD-10-CM | POA: Diagnosis not present

## 2023-08-06 DIAGNOSIS — Z5181 Encounter for therapeutic drug level monitoring: Secondary | ICD-10-CM | POA: Insufficient documentation

## 2023-08-06 DIAGNOSIS — M546 Pain in thoracic spine: Secondary | ICD-10-CM | POA: Insufficient documentation

## 2023-08-06 MED ORDER — HYDROCODONE-ACETAMINOPHEN 7.5-325 MG PO TABS
1.0000 | ORAL_TABLET | Freq: Four times a day (QID) | ORAL | 0 refills | Status: DC | PRN
Start: 2023-08-06 — End: 2023-08-06

## 2023-08-06 MED ORDER — HYDROCODONE-ACETAMINOPHEN 7.5-325 MG PO TABS
1.0000 | ORAL_TABLET | Freq: Four times a day (QID) | ORAL | 0 refills | Status: DC | PRN
Start: 2023-08-06 — End: 2023-10-05

## 2023-08-06 NOTE — Progress Notes (Signed)
Subjective:    Patient ID: Sean [name] Stable, male    DOB: 1946/08/06, 77 y.o.   MRN: [id number]  HPI: Sean [name] is a 77 y.o. male who is scheduled for My-Chart Video Visit today, he had hernia surgery three weeks ago. I connected with Mr. [name] by a video enabled telemedicine application and verified that I am speaking with the correct person using two identifiers.  Location: Patient: In his Home  Provider: In the office    I discussed the limitations of evaluation and management by telemedicine and the availability of in person appointments. The patient expressed understanding and agreed to proceed.  Mr. [name] underwent on 10:09/2022: PR REPAIR ING HERNIA,5+Y/O,REDUCIBL  PR REMOVAL OF HYDROCELE,TUNICA,UNILAT  PR REMOVAL TESTIS,SIMPLE  REPAIR HERNIA INGUINAL WITH OR WITHOUT MESH ADULT  EXCISION HYDROCELECTOMY SCROTAL APPROACH>32YRS  possible 54520 - dp   At [facility].   He states his pain is located in his mid- back. He rates his pain 5. His current exercise regime is walking .   Mr. [name] Morphine equivalent is 33.75 MME.   Last Oral Swab was Performed on 06/11/2023, it was consistent.    Pain Inventory Average Pain 5 Pain Right Now 5 My pain is constant  In the last 24 hours, has pain interfered with the following? General activity 0 Relation with others 0 Enjoyment of life 0 What TIME of day is your pain at its worst? morning , daytime, evening, and night Sleep (in general) Poor  Pain is worse with: walking, bending, sitting, standing, and some activites Pain improves with: medication Relief from Meds: 5  No family history on file. Social History   Socioeconomic History   Marital status: Married    Spouse name: Not on file   Number of children: Not on file   Years of education: Not on file   Highest education level: Not on file  Occupational History   Not on file  Tobacco Use   Smoking status: Former    Types: Cigarettes   Smokeless  tobacco: Never  Vaping Use   Vaping status: Never Used  Substance and Sexual Activity   Alcohol use: Not Currently   Drug use: Never   Sexual activity: Not on file  Other Topics Concern   Not on file  Social History Narrative   Not on file   Social Determinants of Health   Financial Resource Strain: Not on file  Food Insecurity: Not on file  Transportation Needs: Not on file  Physical Activity: Not on file  Stress: Not on file  Social Connections: Not on file   Past Surgical History:  Procedure Laterality Date   BACK SURGERY     BRAIN SURGERY     CHOLECYSTECTOMY     KNEE ARTHROSCOPY     NECK SURGERY     SPINE SURGERY     Spinal Cord Stimulator   THYROIDECTOMY     Past Surgical History:  Procedure Laterality Date   BACK SURGERY     BRAIN SURGERY     CHOLECYSTECTOMY     KNEE ARTHROSCOPY     NECK SURGERY     SPINE SURGERY     Spinal Cord Stimulator   THYROIDECTOMY     Past Medical History:  Diagnosis Date   Diabetes ([facility])    Hypercholesteremia    Hypertension    Kidney stone    Peptic ulcer disease    There were no vitals taken for this visit.  Opioid  Risk Score:   Fall Risk Score:  `1  Depression screen [facility] 2/9     02/12/2023    9:06 AM 12/08/2022   10:33 AM 10/27/2022   11:26 AM 09/15/2022   12:51 PM 05/19/2022   12:59 PM 03/19/2022   12:54 PM 01/20/2022   12:58 PM  Depression screen PHQ 2/9  Decreased Interest 0 0 0 0 0 0 0  Down, Depressed, Hopeless 0 0 0 0 0 0 0  PHQ - 2 Score 0 0 0 0 0 0 0      Review of Systems  Musculoskeletal:  Positive for back pain and gait problem.  All other systems reviewed and are negative.     Objective:   Physical Exam Vitals and nursing note reviewed.  Musculoskeletal:     Comments: No Physical Exam Virtual Visit         Assessment & Plan:  1. Lumbar Post Laminectomy Syndrome:Chronic Thoracic Back Pain and  Bilateral Lower  Back Pain:  Continue HEP as Tolerated. Continue to Monitor. 08/06/2023. 2.  Left Lumbar Radiculitis:No complaints today Continue current medication regimen. Continue to Monitor. 08/06/2023 3. Chronic Bilateral Thoracic Back Pain : Continue HEP as tolerated. Continue to Monitor. 08/06/2023 3. Chronic Pain Syndrome: Refilled:  Hydrocodone 7.5/325 mg one tablet every 6 hours as needed for moderate pain #135 Second script sent for the following month. 08/06/2023 We will continue the opioid monitoring program, this consists of regular clinic visits, examinations, urine drug screen, pill counts as well as use of [place] Controlled Substance Reporting system. A 12 month History has been reviewed on the [place] Controlled Substance Reporting System on 08/06/2023    F/U in 2 months

## 2023-08-09 DIAGNOSIS — R350 Frequency of micturition: Secondary | ICD-10-CM | POA: Diagnosis not present

## 2023-10-05 ENCOUNTER — Encounter: Payer: Self-pay | Admitting: Registered Nurse

## 2023-10-05 ENCOUNTER — Encounter: Payer: Medicare Other | Attending: Physical Medicine & Rehabilitation | Admitting: Registered Nurse

## 2023-10-05 VITALS — BP 125/76 | HR 81 | Ht 67.0 in | Wt 185.0 lb

## 2023-10-05 DIAGNOSIS — M961 Postlaminectomy syndrome, not elsewhere classified: Secondary | ICD-10-CM | POA: Diagnosis not present

## 2023-10-05 DIAGNOSIS — G894 Chronic pain syndrome: Secondary | ICD-10-CM | POA: Insufficient documentation

## 2023-10-05 DIAGNOSIS — M546 Pain in thoracic spine: Secondary | ICD-10-CM | POA: Diagnosis not present

## 2023-10-05 DIAGNOSIS — G8929 Other chronic pain: Secondary | ICD-10-CM | POA: Diagnosis not present

## 2023-10-05 DIAGNOSIS — Z5181 Encounter for therapeutic drug level monitoring: Secondary | ICD-10-CM | POA: Insufficient documentation

## 2023-10-05 DIAGNOSIS — Z79891 Long term (current) use of opiate analgesic: Secondary | ICD-10-CM | POA: Insufficient documentation

## 2023-10-05 MED ORDER — HYDROCODONE-ACETAMINOPHEN 7.5-325 MG PO TABS
1.0000 | ORAL_TABLET | Freq: Four times a day (QID) | ORAL | 0 refills | Status: DC | PRN
Start: 2023-10-05 — End: 2023-10-05

## 2023-10-05 MED ORDER — HYDROCODONE-ACETAMINOPHEN 7.5-325 MG PO TABS: 1.0000 | ORAL_TABLET | Freq: Four times a day (QID) | ORAL | 0 refills | Status: DC | PRN

## 2023-10-05 NOTE — Progress Notes (Signed)
 Subjective:    Patient ID: Sean [name], male    DOB: 07/11/1946, 78 y.o.   MRN: [id number]  HPI: Sean [name] is a 78 y.o. male who returns for follow up appointment for chronic pain and medication refill. He states his pain is located in his mid- back. He rates his pain 6. His current exercise regime is walking and performing stretching exercises.  Sean [name] Morphine equivalent is 33.75 MME.   Last Oral Swab was Performed on 06/11/2023, it was consistent.    Pain Inventory Average Pain 5 Pain Right Now 6 My pain is sharp, burning, stabbing, and aching  In the last 24 hours, has pain interfered with the following? General activity 0 Relation with others 0 Enjoyment of life 0 What TIME of day is your pain at its worst? night Sleep (in general) Poor  Pain is worse with: walking, sitting, standing, and some activites Pain improves with: rest and medication Relief from Meds: 4  No family history on file. Social History   Socioeconomic History   Marital status: Married    Spouse name: Not on file   Number of children: Not on file   Years of education: Not on file   Highest education level: Not on file  Occupational History   Not on file  Tobacco Use   Smoking status: Former    Types: Cigarettes   Smokeless tobacco: Never  Vaping Use   Vaping status: Never Used  Substance and Sexual Activity   Alcohol use: Not Currently   Drug use: Never   Sexual activity: Not on file  Other Topics Concern   Not on file  Social History Narrative   Not on file   Social Drivers of Health   Financial Resource Strain: Not on file  Food Insecurity: Not on file  Transportation Needs: Not on file  Physical Activity: Not on file  Stress: Not on file  Social Connections: Not on file   Past Surgical History:  Procedure Laterality Date   BACK SURGERY     BRAIN SURGERY     CHOLECYSTECTOMY     KNEE ARTHROSCOPY     NECK SURGERY     SPINE SURGERY     Spinal Cord Stimulator    THYROIDECTOMY     Past Surgical History:  Procedure Laterality Date   BACK SURGERY     BRAIN SURGERY     CHOLECYSTECTOMY     KNEE ARTHROSCOPY     NECK SURGERY     SPINE SURGERY     Spinal Cord Stimulator   THYROIDECTOMY     Past Medical History:  Diagnosis Date   Diabetes (HCC)    Hypercholesteremia    Hypertension    Kidney stone    Peptic ulcer disease    BP 125/76   Pulse 81   Ht 5' 7 (1.702 m)   Wt 185 lb (83.9 kg)   SpO2 97%   BMI 28.98 kg/m   Opioid Risk Score:   Fall Risk Score:  `1  Depression screen [facility] 2/9     10/05/2023   12:51 PM 08/06/2023    2:35 PM 02/12/2023    9:06 AM 12/08/2022   10:33 AM 10/27/2022   11:26 AM 09/15/2022   12:51 PM 05/19/2022   12:59 PM  Depression screen PHQ 2/9  Decreased Interest 0 0 0 0 0 0 0  Down, Depressed, Hopeless 0 0 0 0 0 0 0  PHQ - 2 Score 0 0 0  0 0 0 0      Review of Systems  Musculoskeletal:  Positive for back pain and gait problem.  All other systems reviewed and are negative.     Objective:   Physical Exam Vitals and nursing note reviewed.  Constitutional:      Appearance: Normal appearance.  Cardiovascular:     Rate and Rhythm: Normal rate and regular rhythm.     Pulses: Normal pulses.     Heart sounds: Normal heart sounds.  Pulmonary:     Effort: Pulmonary effort is normal.     Breath sounds: Normal breath sounds.  Musculoskeletal:     Comments: Normal Muscle Bulk and Muscle Testing Reveals:  Upper Extremities: Full ROM and Muscle Strength 5/5  Thoracic Paraspinal Tenderness: T-10- T-12 Mainly Right Side   Lower Extremities: Full ROM and Muscle Strength 5/5  Arises from Chair with Ease Narrow Based Gait     Skin:    General: Skin is warm and dry.  Neurological:     Mental Status: He is alert and oriented to person, place, and time.  Psychiatric:        Mood and Affect: Mood normal.        Behavior: Behavior normal.         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 Continue to Monitor. 10/05/2023. 2. Left Lumbar Radiculitis:No complaints today Continue current medication regimen. Continue to Monitor. 10/05/2023 3. Chronic Bilateral Thoracic Back Pain : Continue HEP as tolerated. Continue to Monitor. 10/05/2023 3. Chronic Pain Syndrome: Refilled:  Hydrocodone  7.5/325 mg one tablet every 6 hours as needed for moderate pain #135 Second script sent for the following month. 10/05/2023. We will continue the opioid monitoring program, this consists of regular clinic visits, examinations, urine drug screen, pill counts as well as use of [place]  Controlled Substance Reporting system. A 12 month History has been reviewed on the [place]  Controlled Substance Reporting System on 10/05/2023    F/U in 2 months

## 2023-12-01 NOTE — Progress Notes (Signed)
 Subjective:    Patient ID: Sean [name], male    DOB: Sep 23, 1946, 78 y.o.   MRN: [id number]  HPI: Sean [name] is a 78 y.o. male who is scheduled for a Virtual Visit, I connected with Sean [name] by a video enabled telemedicine application and verified that I am speaking with the correct person using two identifiers.  Location: Patient: In his Home Provider: In the Office   I discussed the limitations of evaluation and management by telemedicine and the availability of in person appointments. The patient expressed understanding and agreed to proceed. He states his pain is located in his mid- lower back radiating into his right lower extremity. He rates his pain 6. His current exercise regime is walking and performing stretching exercises.  Sean [name] Morphine equivalent is 33.75 MME.   Last Oral Swab was Performed on 06/11/2023, it was consistent.    Pain Inventory Average Pain 7 Pain Right Now 6 My pain is constant, sharp, burning, dull, stabbing, tingling, and aching  In the last 24 hours, has pain interfered with the following? General activity 5 Relation with others 0 Enjoyment of life 0 What TIME of day is your pain at its worst? night Sleep (in general) Poor  Pain is worse with: walking, bending, sitting, standing, and some activites Pain improves with: rest, medication, and heat & hot baths Relief from Meds: 7  No family history on file. Social History   Socioeconomic History   Marital status: Married    Spouse name: Not on file   Number of children: Not on file   Years of education: Not on file   Highest education level: Not on file  Occupational History   Not on file  Tobacco Use   Smoking status: Former    Types: Cigarettes   Smokeless tobacco: Never  Vaping Use   Vaping status: Never Used  Substance and Sexual Activity   Alcohol use: Not Currently   Drug use: Never   Sexual activity: Not on file  Other Topics Concern   Not on file   Social History Narrative   Not on file   Social Drivers of Health   Financial Resource Strain: Not on file  Food Insecurity: Not on file  Transportation Needs: Not on file  Physical Activity: Not on file  Stress: Not on file  Social Connections: Not on file   Past Surgical History:  Procedure Laterality Date   BACK SURGERY     BRAIN SURGERY     CHOLECYSTECTOMY     KNEE ARTHROSCOPY     NECK SURGERY     SPINE SURGERY     Spinal Cord Stimulator   THYROIDECTOMY     Past Surgical History:  Procedure Laterality Date   BACK SURGERY     BRAIN SURGERY     CHOLECYSTECTOMY     KNEE ARTHROSCOPY     NECK SURGERY     SPINE SURGERY     Spinal Cord Stimulator   THYROIDECTOMY     Past Medical History:  Diagnosis Date   Diabetes (HCC)    Hypercholesteremia    Hypertension    Kidney stone    Peptic ulcer disease    There were no vitals taken for this visit.  Opioid Risk Score:   Fall Risk Score:  `1  Depression screen [facility] 2/9     10/05/2023   12:51 PM 08/06/2023    2:35 PM 02/12/2023    9:06 AM 12/08/2022   10:33  AM 10/27/2022   11:26 AM 09/15/2022   12:51 PM 05/19/2022   12:59 PM  Depression screen PHQ 2/9  Decreased Interest 0 0 0 0 0 0 0  Down, Depressed, Hopeless 0 0 0 0 0 0 0  PHQ - 2 Score 0 0 0 0 0 0 0    Review of Systems  Musculoskeletal:  Positive for back pain.       Left leg pain  All other systems reviewed and are negative.      Objective:   Physical Exam Vitals and nursing note reviewed.  Musculoskeletal:     Comments: No Physical Exam Performed: Virtual Visit         Assessment & Plan:  1. Lumbar Post Laminectomy Syndrome:Chronic Thoracic Back Pain and  Bilateral Lower  Back Pain:  Continue HEP as Tolerated. Continue to Monitor. 12/02/2023. 2. Left Lumbar Radiculitis:No complaints today Continue current medication regimen. Continue to Monitor. 12/02/2023 3. Chronic Bilateral Thoracic Back Pain : Continue HEP as tolerated. Continue to Monitor.  12/02/2023 3. Chronic Pain Syndrome: Refilled:  Hydrocodone 7.5/325 mg one tablet every 6 hours as needed for moderate pain #135 Second script sent for the following month. 12/02/2023. We will continue the opioid monitoring program, this consists of regular clinic visits, examinations, urine drug screen, pill counts as well as use of [place] Controlled Substance Reporting system. A 12 month History has been reviewed on the [place] Controlled Substance Reporting System on 12/02/2023    F/U in 2 months Virtual Visit Establish Patient  Location of Patient: In his Home Location of Provider: In the Office

## 2023-12-02 ENCOUNTER — Encounter: Payer: Medicare Other | Attending: Physical Medicine & Rehabilitation | Admitting: Registered Nurse

## 2023-12-02 ENCOUNTER — Encounter: Payer: Self-pay | Admitting: Registered Nurse

## 2023-12-02 VITALS — Ht 67.0 in | Wt 190.0 lb

## 2023-12-02 DIAGNOSIS — Z79891 Long term (current) use of opiate analgesic: Secondary | ICD-10-CM | POA: Diagnosis not present

## 2023-12-02 DIAGNOSIS — M5416 Radiculopathy, lumbar region: Secondary | ICD-10-CM | POA: Diagnosis not present

## 2023-12-02 DIAGNOSIS — M546 Pain in thoracic spine: Secondary | ICD-10-CM | POA: Diagnosis not present

## 2023-12-02 DIAGNOSIS — M961 Postlaminectomy syndrome, not elsewhere classified: Secondary | ICD-10-CM | POA: Insufficient documentation

## 2023-12-02 DIAGNOSIS — Z5181 Encounter for therapeutic drug level monitoring: Secondary | ICD-10-CM | POA: Insufficient documentation

## 2023-12-02 DIAGNOSIS — G894 Chronic pain syndrome: Secondary | ICD-10-CM | POA: Insufficient documentation

## 2023-12-02 DIAGNOSIS — G8929 Other chronic pain: Secondary | ICD-10-CM | POA: Insufficient documentation

## 2023-12-02 MED ORDER — HYDROCODONE-ACETAMINOPHEN 7.5-325 MG PO TABS: 1.0000 | ORAL_TABLET | Freq: Four times a day (QID) | ORAL | 0 refills | Status: DC | PRN

## 2024-01-26 NOTE — Progress Notes (Unsigned)
 Subjective:    Patient ID: Sean [name], male    DOB: 1946-09-16, 78 y.o.   MRN: [id number]  HPI: Sean [name] is a 78 y.o. male who returns for follow up appointment for chronic pain and medication refill. states *** pain is located in  ***. rates pain ***. current exercise regime is walking and performing stretching exercises.  Ms. Goldsworthy Morphine equivalent is *** MME.   Oral Swab was Performed today.      Pain Inventory Average Pain 5 Pain Right Now 8 My pain is intermittent, constant, and dull  In the last 24 hours, has pain interfered with the following? General activity 5 Relation with others 5 Enjoyment of life 5 What TIME of day is your pain at its worst? night Sleep (in general) Poor  Pain is worse with: sitting Pain improves with: heat/ice, medication, and TENS Relief from Meds:  fair  History reviewed. No pertinent family history. Social History   Socioeconomic History   Marital status: Married    Spouse name: Not on file   Number of children: Not on file   Years of education: Not on file   Highest education level: Not on file  Occupational History   Not on file  Tobacco Use   Smoking status: Former    Types: Cigarettes   Smokeless tobacco: Never  Vaping Use   Vaping status: Never Used  Substance and Sexual Activity   Alcohol use: Not Currently   Drug use: Never   Sexual activity: Not on file  Other Topics Concern   Not on file  Social History Narrative   Not on file   Social Drivers of Health   Financial Resource Strain: Not on file  Food Insecurity: Not on file  Transportation Needs: Not on file  Physical Activity: Not on file  Stress: Not on file  Social Connections: Not on file   Past Surgical History:  Procedure Laterality Date   BACK SURGERY     BRAIN SURGERY     CHOLECYSTECTOMY     KNEE ARTHROSCOPY     NECK SURGERY     SPINE SURGERY     Spinal Cord Stimulator   THYROIDECTOMY     Past Surgical History:  Procedure  Laterality Date   BACK SURGERY     BRAIN SURGERY     CHOLECYSTECTOMY     KNEE ARTHROSCOPY     NECK SURGERY     SPINE SURGERY     Spinal Cord Stimulator   THYROIDECTOMY     Past Medical History:  Diagnosis Date   Diabetes (HCC)    Hypercholesteremia    Hypertension    Kidney stone    Peptic ulcer disease    BP 111/73   Pulse 83   Ht 5\' 7"  (1.702 m)   Wt 189 lb (85.7 kg)   SpO2 96%   BMI 29.60 kg/m   Opioid Risk Score:   Fall Risk Score:  `1  Depression screen PHQ 2/9     12/02/2023    1:00 PM 10/05/2023   12:51 PM 08/06/2023    2:35 PM 02/12/2023    9:06 AM 12/08/2022   10:33 AM 10/27/2022   11:26 AM 09/15/2022   12:51 PM  Depression screen PHQ 2/9  Decreased Interest 0 0 0 0 0 0 0  Down, Depressed, Hopeless 0 0 0 0 0 0 0  PHQ - 2 Score 0 0 0 0 0 0 0    Review of Systems  Musculoskeletal:  Positive for back pain.       Pain in left hip down to left ankle  All other systems reviewed and are negative.      Objective:   Physical Exam        Assessment & Plan:  1. Lumbar Post Laminectomy Syndrome:Chronic Thoracic Back Pain and  Bilateral Lower  Back Pain:  Continue HEP as Tolerated. Continue to Monitor. 12/02/2023. 2. Left Lumbar Radiculitis:No complaints today Continue current medication regimen. Continue to Monitor. 12/02/2023 3. Chronic Bilateral Thoracic Back Pain : Continue HEP as tolerated. Continue to Monitor. 12/02/2023 3. Chronic Pain Syndrome: Refilled:  Hydrocodone  7.5/325 mg one tablet every 6 hours as needed for moderate pain #135 Second script sent for the following month. 12/02/2023. We will continue the opioid monitoring program, this consists of regular clinic visits, examinations, urine drug screen, pill counts as well as use of [place]  Controlled Substance Reporting system. A 12 month History has been reviewed on the   Controlled Substance Reporting System on 12/02/2023    F/U in 2 months

## 2024-01-27 ENCOUNTER — Encounter: Attending: Physical Medicine & Rehabilitation | Admitting: Registered Nurse

## 2024-01-27 ENCOUNTER — Encounter: Payer: Self-pay | Admitting: Registered Nurse

## 2024-01-27 VITALS — BP 111/73 | HR 83 | Ht 67.0 in | Wt 189.0 lb

## 2024-01-27 DIAGNOSIS — G894 Chronic pain syndrome: Secondary | ICD-10-CM

## 2024-01-27 DIAGNOSIS — M546 Pain in thoracic spine: Secondary | ICD-10-CM | POA: Diagnosis not present

## 2024-01-27 DIAGNOSIS — M5416 Radiculopathy, lumbar region: Secondary | ICD-10-CM | POA: Diagnosis not present

## 2024-01-27 DIAGNOSIS — M961 Postlaminectomy syndrome, not elsewhere classified: Secondary | ICD-10-CM | POA: Diagnosis not present

## 2024-01-27 DIAGNOSIS — G8929 Other chronic pain: Secondary | ICD-10-CM | POA: Diagnosis not present

## 2024-01-27 DIAGNOSIS — Z5181 Encounter for therapeutic drug level monitoring: Secondary | ICD-10-CM

## 2024-01-27 DIAGNOSIS — Z79891 Long term (current) use of opiate analgesic: Secondary | ICD-10-CM

## 2024-01-27 MED ORDER — HYDROCODONE-ACETAMINOPHEN 7.5-325 MG PO TABS
1.0000 | ORAL_TABLET | Freq: Four times a day (QID) | ORAL | 0 refills | Status: DC | PRN
Start: 2024-01-27 — End: 2024-03-29

## 2024-01-27 MED ORDER — HYDROCODONE-ACETAMINOPHEN 7.5-325 MG PO TABS: 1.0000 | ORAL_TABLET | Freq: Four times a day (QID) | ORAL | 0 refills | Status: DC | PRN

## 2024-02-01 LAB — DRUG TOX MONITOR 1 W/CONF, ORAL FLD
Amphetamines: NEGATIVE ng/mL (ref <10)
Barbiturates: NEGATIVE ng/mL (ref <10)
Benzodiazepines: NEGATIVE ng/mL (ref <0.50)
Buprenorphine: NEGATIVE ng/mL (ref <0.10)
Cocaine: NEGATIVE ng/mL (ref <5.0)
Codeine: NEGATIVE ng/mL (ref <2.5)
Dihydrocodeine: 11.6 ng/mL — ABNORMAL HIGH (ref <2.5)
Fentanyl: NEGATIVE ng/mL (ref <0.10)
Heroin Metabolite: NEGATIVE ng/mL (ref <1.0)
Hydrocodone: 99.9 ng/mL — ABNORMAL HIGH (ref <2.5)
Hydromorphone: NEGATIVE ng/mL (ref <2.5)
MARIJUANA: NEGATIVE ng/mL (ref <2.5)
MDMA: NEGATIVE ng/mL (ref <10)
Meprobamate: NEGATIVE ng/mL (ref <2.5)
Methadone: NEGATIVE ng/mL (ref <5.0)
Morphine: NEGATIVE ng/mL (ref <2.5)
Nicotine Metabolite: NEGATIVE ng/mL (ref <5.0)
Norhydrocodone: NEGATIVE ng/mL (ref <2.5)
Noroxycodone: NEGATIVE ng/mL (ref <2.5)
Opiates: POSITIVE ng/mL — AB (ref <2.5)
Oxycodone: NEGATIVE ng/mL (ref <2.5)
Oxymorphone: NEGATIVE ng/mL (ref <2.5)
Phencyclidine: NEGATIVE ng/mL (ref <10)
Tapentadol: NEGATIVE ng/mL (ref <5.0)
Tramadol: NEGATIVE ng/mL (ref <5.0)
Zolpidem: NEGATIVE ng/mL (ref <5.0)

## 2024-02-01 LAB — DRUG TOX ALC METAB W/CON, ORAL FLD: Alcohol Metabolite: NEGATIVE ng/mL (ref <25)

## 2024-02-10 DIAGNOSIS — E063 Autoimmune thyroiditis: Secondary | ICD-10-CM | POA: Diagnosis not present

## 2024-02-10 DIAGNOSIS — K219 Gastro-esophageal reflux disease without esophagitis: Secondary | ICD-10-CM | POA: Diagnosis not present

## 2024-02-10 DIAGNOSIS — E785 Hyperlipidemia, unspecified: Secondary | ICD-10-CM | POA: Diagnosis not present

## 2024-02-10 DIAGNOSIS — E1165 Type 2 diabetes mellitus with hyperglycemia: Secondary | ICD-10-CM | POA: Diagnosis not present

## 2024-02-10 DIAGNOSIS — I10 Essential (primary) hypertension: Secondary | ICD-10-CM | POA: Diagnosis not present

## 2024-03-06 DIAGNOSIS — Z9181 History of falling: Secondary | ICD-10-CM | POA: Diagnosis not present

## 2024-03-06 DIAGNOSIS — Z0000 Encounter for general adult medical examination without abnormal findings: Secondary | ICD-10-CM | POA: Diagnosis not present

## 2024-03-28 ENCOUNTER — Ambulatory Visit: Admitting: Registered Nurse

## 2024-03-29 ENCOUNTER — Ambulatory Visit: Admitting: Registered Nurse

## 2024-03-29 ENCOUNTER — Encounter: Attending: Physical Medicine & Rehabilitation | Admitting: Registered Nurse

## 2024-03-29 VITALS — BP 132/80 | HR 98 | Ht 67.0 in | Wt 182.0 lb

## 2024-03-29 DIAGNOSIS — M5416 Radiculopathy, lumbar region: Secondary | ICD-10-CM | POA: Diagnosis present

## 2024-03-29 DIAGNOSIS — G8929 Other chronic pain: Secondary | ICD-10-CM | POA: Diagnosis present

## 2024-03-29 DIAGNOSIS — M546 Pain in thoracic spine: Secondary | ICD-10-CM | POA: Diagnosis present

## 2024-03-29 DIAGNOSIS — G894 Chronic pain syndrome: Secondary | ICD-10-CM | POA: Diagnosis present

## 2024-03-29 DIAGNOSIS — M961 Postlaminectomy syndrome, not elsewhere classified: Secondary | ICD-10-CM | POA: Diagnosis present

## 2024-03-29 DIAGNOSIS — Z79891 Long term (current) use of opiate analgesic: Secondary | ICD-10-CM | POA: Diagnosis present

## 2024-03-29 DIAGNOSIS — Z5181 Encounter for therapeutic drug level monitoring: Secondary | ICD-10-CM | POA: Insufficient documentation

## 2024-03-29 MED ORDER — HYDROCODONE-ACETAMINOPHEN 7.5-325 MG PO TABS: 1.0000 | ORAL_TABLET | Freq: Four times a day (QID) | ORAL | 0 refills | Status: DC | PRN

## 2024-03-29 MED ORDER — HYDROCODONE-ACETAMINOPHEN 7.5-325 MG PO TABS
1.0000 | ORAL_TABLET | Freq: Four times a day (QID) | ORAL | 0 refills | Status: DC | PRN
Start: 2024-03-29 — End: 2024-03-29

## 2024-03-29 NOTE — Progress Notes (Signed)
 Subjective:    Patient ID: Sean [name], male    DOB: Aug 07, 1946, 78 y.o.   MRN: [id number]  HPI: Sean [name] is a 78 y.o. male who returns for follow up appointment for chronic pain and medication refill. He states his pain is located in his mid- lower back radiating into his left lower extremity. He rates his pain 6. His current exercise regime is walking and performing stretching exercises.  Sean [name] Morphine equivalent is 33.75 MME.   Last Order Swab was Performed on 01/27/2024, it was consistent.     Pain Inventory Average Pain 6 Pain Right Now 6 My pain is sharp  In the last 24 hours, has pain interfered with the following? General activity 4 Relation with others 6 Enjoyment of life 5 What TIME of day is your pain at its worst? night Sleep (in general) Poor  Pain is worse with: walking, sitting, and standing Pain improves with: TENS Relief from Meds: 7  No family history on file. Social History   Socioeconomic History   Marital status: Married    Spouse name: Not on file   Number of children: Not on file   Years of education: Not on file   Highest education level: Not on file  Occupational History   Not on file  Tobacco Use   Smoking status: Former    Types: Cigarettes   Smokeless tobacco: Never  Vaping Use   Vaping status: Never Used  Substance and Sexual Activity   Alcohol use: Not Currently   Drug use: Never   Sexual activity: Not on file  Other Topics Concern   Not on file  Social History Narrative   Not on file   Social Drivers of Health   Financial Resource Strain: Not on file  Food Insecurity: Not on file  Transportation Needs: Not on file  Physical Activity: Not on file  Stress: Not on file  Social Connections: Not on file   Past Surgical History:  Procedure Laterality Date   BACK SURGERY     BRAIN SURGERY     CHOLECYSTECTOMY     KNEE ARTHROSCOPY     NECK SURGERY     SPINE SURGERY     Spinal Cord Stimulator    THYROIDECTOMY     Past Surgical History:  Procedure Laterality Date   BACK SURGERY     BRAIN SURGERY     CHOLECYSTECTOMY     KNEE ARTHROSCOPY     NECK SURGERY     SPINE SURGERY     Spinal Cord Stimulator   THYROIDECTOMY     Past Medical History:  Diagnosis Date   Diabetes (HCC)    Hypercholesteremia    Hypertension    Kidney stone    Peptic ulcer disease    BP 132/80   Pulse 98   Ht 5' 7 (1.702 m)   Wt 182 lb (82.6 kg)   SpO2 92%   BMI 28.51 kg/m   Opioid Risk Score:   Fall Risk Score:  `1  Depression screen [facility] 2/9     01/27/2024   12:50 PM 12/02/2023    1:00 PM 10/05/2023   12:51 PM 08/06/2023    2:35 PM 02/12/2023    9:06 AM 12/08/2022   10:33 AM 10/27/2022   11:26 AM  Depression screen PHQ 2/9  Decreased Interest 0 0 0 0 0 0 0  Down, Depressed, Hopeless 0 0 0 0 0 0 0  PHQ - 2 Score 0 0  0 0 0 0 0     Review of Systems  Musculoskeletal:  Positive for back pain.       Left leg pain   All other systems reviewed and are negative.      Objective:   Physical Exam Vitals and nursing note reviewed.  Constitutional:      Appearance: Normal appearance.  Cardiovascular:     Rate and Rhythm: Normal rate and regular rhythm.     Pulses: Normal pulses.     Heart sounds: Normal heart sounds.  Pulmonary:     Effort: Pulmonary effort is normal.     Breath sounds: Normal breath sounds.  Musculoskeletal:     Comments: Normal Muscle Bulk and Muscle Testing Reveals:  Upper Extremities: Full ROM and Muscle Strength 5/5 Thoracic Paraspinal Tenderness: T-4-T-6  Lumbar Paraspinal Tenderness: L-1-L-2 Lower Extremities: Full ROM and Muscle Strength 5/5 Arises from Table slowly  Narrow Based  Gait     Skin:    General: Skin is warm and dry.  Neurological:     Mental Status: He is alert and oriented to person, place, and time.  Psychiatric:        Mood and Affect: Mood normal.        Behavior: Behavior normal.          Assessment & Plan:  1. Lumbar Post  Laminectomy Syndrome:Chronic Thoracic Back Pain and  Bilateral Lower  Back Pain:  Continue HEP as Tolerated. Continue to Monitor. 03/29/2024. 2. Left Lumbar Radiculitis: Continue current medication regimen. Continue to Monitor. 03/29/2024 3. Chronic Bilateral Thoracic Back Pain : Continue HEP as tolerated. Continue to Monitor. 03/29/2024 3. Chronic Pain Syndrome: Refilled:  Hydrocodone  7.5/325 mg one tablet every 6 hours as needed for moderate pain #135 Second script sent for the following month. 03/29/2024. We will continue the opioid monitoring program, this consists of regular clinic visits, examinations, urine drug screen, pill counts as well as use of [place]  Controlled Substance Reporting system. A 12 month History has been reviewed on the [place]  Controlled Substance Reporting System on 03/29/2024    F/U in 2 months

## 2024-04-08 ENCOUNTER — Encounter: Payer: Self-pay | Admitting: Registered Nurse

## 2024-05-25 ENCOUNTER — Telehealth: Payer: Self-pay | Admitting: Registered Nurse

## 2024-05-25 DIAGNOSIS — M961 Postlaminectomy syndrome, not elsewhere classified: Secondary | ICD-10-CM

## 2024-05-25 DIAGNOSIS — G8929 Other chronic pain: Secondary | ICD-10-CM

## 2024-05-25 DIAGNOSIS — G894 Chronic pain syndrome: Secondary | ICD-10-CM

## 2024-05-25 MED ORDER — HYDROCODONE-ACETAMINOPHEN 7.5-325 MG PO TABS: 1.0000 | ORAL_TABLET | Freq: Four times a day (QID) | ORAL | 0 refills | Status: DC | PRN

## 2024-05-25 NOTE — Telephone Encounter (Signed)
 PMP was Reviewed.  Hydrocodone  e-scribed to pharmacy His appointment was changed he will be having cataract surgery.  Daughter is aware of the above, he verbalizes understanding.

## 2024-05-30 ENCOUNTER — Encounter: Admitting: Registered Nurse

## 2024-06-26 ENCOUNTER — Encounter: Payer: Self-pay | Admitting: Registered Nurse

## 2024-06-26 ENCOUNTER — Encounter: Attending: Physical Medicine & Rehabilitation | Admitting: Registered Nurse

## 2024-06-26 VITALS — BP 121/74 | HR 81 | Ht 67.0 in | Wt 181.0 lb

## 2024-06-26 DIAGNOSIS — M961 Postlaminectomy syndrome, not elsewhere classified: Secondary | ICD-10-CM | POA: Diagnosis present

## 2024-06-26 DIAGNOSIS — M546 Pain in thoracic spine: Secondary | ICD-10-CM | POA: Diagnosis present

## 2024-06-26 DIAGNOSIS — Z5181 Encounter for therapeutic drug level monitoring: Secondary | ICD-10-CM | POA: Diagnosis not present

## 2024-06-26 DIAGNOSIS — G894 Chronic pain syndrome: Secondary | ICD-10-CM | POA: Diagnosis present

## 2024-06-26 DIAGNOSIS — G8929 Other chronic pain: Secondary | ICD-10-CM | POA: Diagnosis present

## 2024-06-26 DIAGNOSIS — Z79891 Long term (current) use of opiate analgesic: Secondary | ICD-10-CM | POA: Insufficient documentation

## 2024-06-26 MED ORDER — HYDROCODONE-ACETAMINOPHEN 7.5-325 MG PO TABS: 1.0000 | ORAL_TABLET | Freq: Four times a day (QID) | ORAL | 0 refills | Status: DC | PRN

## 2024-06-26 NOTE — Progress Notes (Signed)
 Subjective:    Patient ID: Sean [name], male    DOB: [birth year], 78 y.o.   MRN: [id number]  HPI: Yecheskel L Lovelady is a 78 y.o. male who returns for follow up appointment for chronic pain and medication refill. He states his pain is located in his mid- back. He rates his pain 8. His current exercise regime is walking and performing stretching exercises.  Mr. Heidrich Morphine equivalent is 33.75 MME.   Oral Swab was Performed today.   Pain Inventory Average Pain 6 Pain Right Now 8 My pain is sharp  In the last 24 hours, has pain interfered with the following? General activity 6 Relation with others 6 Enjoyment of life 6 What TIME of day is your pain at its worst? night Sleep (in general) Poor  Pain is worse with: sitting Pain improves with: medication and TENS Relief from Meds: 5  No family history on file. Social History   Socioeconomic History   Marital status: Married    Spouse name: Not on file   Number of children: Not on file   Years of education: Not on file   Highest education level: Not on file  Occupational History   Not on file  Tobacco Use   Smoking status: Former    Types: Cigarettes   Smokeless tobacco: Never  Vaping Use   Vaping status: Never Used  Substance and Sexual Activity   Alcohol use: Not Currently   Drug use: Never   Sexual activity: Not on file  Other Topics Concern   Not on file  Social History Narrative   Not on file   Social Drivers of Health   Financial Resource Strain: Not on file  Food Insecurity: Not on file  Transportation Needs: Not on file  Physical Activity: Not on file  Stress: Not on file  Social Connections: Not on file   Past Surgical History:  Procedure Laterality Date   BACK SURGERY     BRAIN SURGERY     CHOLECYSTECTOMY     KNEE ARTHROSCOPY     NECK SURGERY     SPINE SURGERY     Spinal Cord Stimulator   THYROIDECTOMY     Past Surgical History:  Procedure Laterality Date   BACK SURGERY     BRAIN  SURGERY     CHOLECYSTECTOMY     KNEE ARTHROSCOPY     NECK SURGERY     SPINE SURGERY     Spinal Cord Stimulator   THYROIDECTOMY     Past Medical History:  Diagnosis Date   Diabetes (HCC)    Hypercholesteremia    Hypertension    Kidney stone    Peptic ulcer disease    BP 121/74 (BP Location: Left Arm, Patient Position: Sitting, Cuff Size: Large)   Pulse 81   Ht 5' 7 (1.702 m)   Wt 181 lb (82.1 kg)   SpO2 96%   BMI 28.35 kg/m   Opioid Risk Score:   Fall Risk Score:  `1  Depression screen PHQ 2/9     06/26/2024    1:47 PM 01/27/2024   12:50 PM 12/02/2023    1:00 PM 10/05/2023   12:51 PM 08/06/2023    2:35 PM 02/12/2023    9:06 AM 12/08/2022   10:33 AM  Depression screen PHQ 2/9  Decreased Interest 0 0 0 0 0 0 0  Down, Depressed, Hopeless 0 0 0 0 0 0 0  PHQ - 2 Score 0 0 0 0 0 0  0      Review of Systems  Musculoskeletal:  Positive for arthralgias, back pain and myalgias.       Upper and lower back pain, left leg pain  All other systems reviewed and are negative.      Objective:   Physical Exam Vitals and nursing note reviewed.  Constitutional:      Appearance: Normal appearance.  Cardiovascular:     Rate and Rhythm: Normal rate and regular rhythm.     Pulses: Normal pulses.     Heart sounds: Normal heart sounds.  Pulmonary:     Effort: Pulmonary effort is normal.     Breath sounds: Normal breath sounds.  Musculoskeletal:     Comments: Normal Muscle Bulk and Muscle Testing Reveals:  Upper Extremities: Full ROM and Muscle Strength 5/5  Thoracic Paraspinal Tenderness: T-10- T-12  Lower Extremities: Full ROM and Muscle Strength 5/5 Arises from chair slowly Narrow Based  Gait     Skin:    General: Skin is warm and dry.  Neurological:     Mental Status: He is alert and oriented to person, place, and time.  Psychiatric:        Mood and Affect: Mood normal.        Behavior: Behavior normal.          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 Continue to Monitor. 06/26/2024. 2. Left Lumbar Radiculitis: No complaints today. Continue current medication regimen. Continue to Monitor. 06/26/2024 3. Chronic Bilateral Thoracic Back Pain : Continue HEP as tolerated. Continue to Monitor. 06/26/2024 3. Chronic Pain Syndrome: Refilled:  Hydrocodone  7.5/325 mg one tablet every 6 hours as needed for moderate pain #135 Second script sent for the following month. 06/26/2024. We will continue the opioid monitoring program, this consists of regular clinic visits, examinations, urine drug screen, pill counts as well as use of [place]  Controlled Substance Reporting system. A 12 month History has been reviewed on the [place]  Controlled Substance Reporting System on 06/26/2024    F/U in 2 months

## 2024-06-27 ENCOUNTER — Ambulatory Visit: Admitting: Registered Nurse

## 2024-06-29 LAB — DRUG TOX MONITOR 1 W/CONF, ORAL FLD
Amphetamines: NEGATIVE ng/mL (ref <10)
Barbiturates: NEGATIVE ng/mL (ref <10)
Benzodiazepines: NEGATIVE ng/mL (ref <0.50)
Buprenorphine: NEGATIVE ng/mL (ref <0.10)
Cocaine: NEGATIVE ng/mL (ref <5.0)
Codeine: NEGATIVE ng/mL (ref <2.5)
Dihydrocodeine: 15.5 ng/mL — ABNORMAL HIGH (ref <2.5)
Fentanyl: NEGATIVE ng/mL (ref <0.10)
Heroin Metabolite: NEGATIVE ng/mL (ref <1.0)
Hydrocodone: 93.4 ng/mL — ABNORMAL HIGH (ref <2.5)
Hydromorphone: NEGATIVE ng/mL (ref <2.5)
MARIJUANA: NEGATIVE ng/mL (ref <2.5)
MDMA: NEGATIVE ng/mL (ref <10)
Meprobamate: NEGATIVE ng/mL (ref <2.5)
Methadone: NEGATIVE ng/mL (ref <5.0)
Morphine: NEGATIVE ng/mL (ref <2.5)
Nicotine Metabolite: NEGATIVE ng/mL (ref <5.0)
Norhydrocodone: NEGATIVE ng/mL (ref <2.5)
Noroxycodone: NEGATIVE ng/mL (ref <2.5)
Opiates: POSITIVE ng/mL — AB (ref <2.5)
Oxycodone: NEGATIVE ng/mL (ref <2.5)
Oxymorphone: NEGATIVE ng/mL (ref <2.5)
Phencyclidine: NEGATIVE ng/mL (ref <10)
Tapentadol: NEGATIVE ng/mL (ref <5.0)
Tramadol: NEGATIVE ng/mL (ref <5.0)
Zolpidem: NEGATIVE ng/mL (ref <5.0)

## 2024-06-29 LAB — DRUG TOX ALC METAB W/CON, ORAL FLD: Alcohol Metabolite: NEGATIVE ng/mL (ref <25)

## 2024-07-26 ENCOUNTER — Other Ambulatory Visit (HOSPITAL_COMMUNITY): Payer: Self-pay

## 2024-08-21 ENCOUNTER — Encounter: Payer: Self-pay | Attending: Physical Medicine & Rehabilitation | Admitting: Registered Nurse

## 2024-08-21 ENCOUNTER — Encounter: Payer: Self-pay | Admitting: Registered Nurse

## 2024-08-21 VITALS — BP 112/77 | HR 83 | Ht 67.0 in | Wt 188.8 lb

## 2024-08-21 DIAGNOSIS — G894 Chronic pain syndrome: Secondary | ICD-10-CM | POA: Insufficient documentation

## 2024-08-21 DIAGNOSIS — Z5181 Encounter for therapeutic drug level monitoring: Secondary | ICD-10-CM | POA: Diagnosis not present

## 2024-08-21 DIAGNOSIS — M961 Postlaminectomy syndrome, not elsewhere classified: Secondary | ICD-10-CM | POA: Insufficient documentation

## 2024-08-21 DIAGNOSIS — Z79891 Long term (current) use of opiate analgesic: Secondary | ICD-10-CM | POA: Insufficient documentation

## 2024-08-21 DIAGNOSIS — M546 Pain in thoracic spine: Secondary | ICD-10-CM | POA: Diagnosis not present

## 2024-08-21 DIAGNOSIS — M5416 Radiculopathy, lumbar region: Secondary | ICD-10-CM | POA: Insufficient documentation

## 2024-08-21 DIAGNOSIS — G8929 Other chronic pain: Secondary | ICD-10-CM | POA: Diagnosis present

## 2024-08-21 MED ORDER — HYDROCODONE-ACETAMINOPHEN 7.5-325 MG PO TABS: 1.0000 | ORAL_TABLET | Freq: Four times a day (QID) | ORAL | 0 refills | Status: DC | PRN

## 2024-08-21 MED ORDER — HYDROCODONE-ACETAMINOPHEN 7.5-325 MG PO TABS
1.0000 | ORAL_TABLET | Freq: Four times a day (QID) | ORAL | 0 refills | Status: DC | PRN
Start: 2024-08-21 — End: 2024-08-21

## 2024-08-21 NOTE — Progress Notes (Signed)
 Subjective:    Patient ID: Sean [name], male    DOB: 11-15-1945, 78 y.o.   MRN: [id number]  HPI: [name] is a 78 y.o. male who returns for follow up appointment for chronic pain and medication refill. He states his pain is located in his lower bac and generalized joint pain. He rates his pain 9. His current exercise regime is walking and performing stretching exercises.  [name] Morphine equivalent is 33.75 MME.   Last Oral Swab was Performed on 06/26/2024, it was consistent.    Pain Inventory Average Pain 5 Pain Right Now 9 My pain is sharp and aching  In the last 24 hours, has pain interfered with the following? General activity 5 Relation with others 5 Enjoyment of life 5 What TIME of day is your pain at its worst? night Sleep (in general) Poor  Pain is worse with: walking, sitting, and standing Pain improves with: rest, heat/ice, and TENS Relief from Meds: 6  No family history on file. Social History   Socioeconomic History   Marital status: Married    Spouse name: Not on file   Number of children: Not on file   Years of education: Not on file   Highest education level: Not on file  Occupational History   Not on file  Tobacco Use   Smoking status: Former    Types: Cigarettes   Smokeless tobacco: Never  Vaping Use   Vaping status: Never Used  Substance and Sexual Activity   Alcohol use: Not Currently   Drug use: Never   Sexual activity: Not on file  Other Topics Concern   Not on file  Social History Narrative   Not on file   Social Drivers of Health   Financial Resource Strain: Not on file  Food Insecurity: Not on file  Transportation Needs: Not on file  Physical Activity: Not on file  Stress: Not on file  Social Connections: Not on file   Past Surgical History:  Procedure Laterality Date   BACK SURGERY     BRAIN SURGERY     CHOLECYSTECTOMY     KNEE ARTHROSCOPY     NECK SURGERY     SPINE SURGERY     Spinal Cord Stimulator    THYROIDECTOMY     Past Surgical History:  Procedure Laterality Date   BACK SURGERY     BRAIN SURGERY     CHOLECYSTECTOMY     KNEE ARTHROSCOPY     NECK SURGERY     SPINE SURGERY     Spinal Cord Stimulator   THYROIDECTOMY     Past Medical History:  Diagnosis Date   Diabetes (HCC)    Hypercholesteremia    Hypertension    Kidney stone    Peptic ulcer disease    BP 112/77 (BP Location: Left Arm, Patient Position: Sitting, Cuff Size: Large)   Pulse 83   Ht 5' 7 (1.702 m)   Wt 188 lb 12.8 oz (85.6 kg)   SpO2 96%   BMI 29.57 kg/m   Opioid Risk Score:   Fall Risk Score:  `1  Depression screen PHQ 2/9     06/26/2024    1:47 PM 01/27/2024   12:50 PM 12/02/2023    1:00 PM 10/05/2023   12:51 PM 08/06/2023    2:35 PM 02/12/2023    9:06 AM 12/08/2022   10:33 AM  Depression screen PHQ 2/9  Decreased Interest 0 0 0 0 0 0 0  Down, Depressed, Hopeless  0 0 0 0 0 0 0  PHQ - 2 Score 0 0 0 0 0 0 0      Review of Systems  Musculoskeletal:  Positive for back pain and myalgias.       Back pain, left leg pain  All other systems reviewed and are negative.      Objective:   Physical Exam Vitals and nursing note reviewed.  Constitutional:      Appearance: Normal appearance.  Cardiovascular:     Rate and Rhythm: Normal rate and regular rhythm.     Pulses: Normal pulses.     Heart sounds: Normal heart sounds.  Pulmonary:     Effort: Pulmonary effort is normal.     Breath sounds: Normal breath sounds.  Musculoskeletal:     Comments: Normal Muscle Bulk and Muscle Testing Reveals:  Upper Extremities: Full ROM and Muscle Strength 5/5 Thoracic Paraspinal Tenderness: T-4- T-6 Lower Extremities: Full ROM and Muscle Strength 5/5 Arises from Chair slowly Narrow Based  Gait     Skin:    General: Skin is warm and dry.  Neurological:     Mental Status: He is alert and oriented to person, place, and time.  Psychiatric:        Mood and Affect: Mood normal.        Behavior: Behavior  normal.          Assessment & Plan:  1. Lumbar Post Laminectomy Syndrome:Chronic Thoracic Back Pain and  Bilateral Lower  Back Pain:  Continue HEP as Tolerated. Continue to Monitor. 08/21/2024. 2. Left Lumbar Radiculitis: No complaints today. Continue current medication regimen. Continue to Monitor. 08/21/2024 3. Chronic Bilateral Thoracic Back Pain : Continue HEP as tolerated. Continue to Monitor. 08/21/2024 3. Chronic Pain Syndrome: Refilled:  Hydrocodone  7.5/325 mg one tablet every 6 hours as needed for moderate pain #135 Second script sent for the following month. 08/21/2024. We will continue the opioid monitoring program, this consists of regular clinic visits, examinations, urine drug screen, pill counts as well as use of [place]  Controlled Substance Reporting system. A 12 month History has been reviewed on the [place]  Controlled Substance Reporting System on 08/21/2024    F/U in 2 months

## 2024-10-19 ENCOUNTER — Other Ambulatory Visit (HOSPITAL_COMMUNITY): Payer: Self-pay

## 2024-10-19 ENCOUNTER — Other Ambulatory Visit (HOSPITAL_BASED_OUTPATIENT_CLINIC_OR_DEPARTMENT_OTHER): Payer: Self-pay

## 2024-10-19 MED ORDER — OZEMPIC (0.25 OR 0.5 MG/DOSE) 2 MG/3ML ~~LOC~~ SOPN
0.5000 mg | PEN_INJECTOR | SUBCUTANEOUS | 0 refills | Status: AC
  Filled 2024-10-19: qty 3, 28d supply, fill #0
  Filled 2024-10-24: qty 9, 84d supply, fill #0
  Filled 2024-10-24: qty 3, 28d supply, fill #0

## 2024-10-20 ENCOUNTER — Other Ambulatory Visit (HOSPITAL_COMMUNITY): Payer: Self-pay

## 2024-10-21 ENCOUNTER — Other Ambulatory Visit (HOSPITAL_COMMUNITY): Payer: Self-pay

## 2024-10-22 ENCOUNTER — Other Ambulatory Visit (HOSPITAL_COMMUNITY): Payer: Self-pay

## 2024-10-24 ENCOUNTER — Encounter: Payer: Self-pay | Admitting: Registered Nurse

## 2024-10-24 ENCOUNTER — Other Ambulatory Visit: Payer: Self-pay

## 2024-10-24 ENCOUNTER — Encounter: Attending: Physical Medicine & Rehabilitation | Admitting: Registered Nurse

## 2024-10-24 ENCOUNTER — Other Ambulatory Visit (HOSPITAL_COMMUNITY): Payer: Self-pay

## 2024-10-24 VITALS — BP 118/83 | HR 101 | Ht 67.0 in | Wt 196.0 lb

## 2024-10-24 DIAGNOSIS — G894 Chronic pain syndrome: Secondary | ICD-10-CM | POA: Diagnosis not present

## 2024-10-24 DIAGNOSIS — Z5181 Encounter for therapeutic drug level monitoring: Secondary | ICD-10-CM | POA: Diagnosis not present

## 2024-10-24 DIAGNOSIS — G8929 Other chronic pain: Secondary | ICD-10-CM | POA: Insufficient documentation

## 2024-10-24 DIAGNOSIS — Z79891 Long term (current) use of opiate analgesic: Secondary | ICD-10-CM | POA: Diagnosis not present

## 2024-10-24 DIAGNOSIS — M5416 Radiculopathy, lumbar region: Secondary | ICD-10-CM | POA: Insufficient documentation

## 2024-10-24 DIAGNOSIS — M546 Pain in thoracic spine: Secondary | ICD-10-CM | POA: Insufficient documentation

## 2024-10-24 DIAGNOSIS — M961 Postlaminectomy syndrome, not elsewhere classified: Secondary | ICD-10-CM | POA: Insufficient documentation

## 2024-10-24 MED ORDER — HYDROCODONE-ACETAMINOPHEN 7.5-325 MG PO TABS: 1.0000 | ORAL_TABLET | Freq: Four times a day (QID) | ORAL | 0 refills | Status: DC | PRN

## 2024-10-24 MED ORDER — HYDROCODONE-ACETAMINOPHEN 7.5-325 MG PO TABS: 1.0000 | ORAL_TABLET | Freq: Four times a day (QID) | ORAL | 0 refills | Status: AC | PRN

## 2024-10-24 NOTE — Progress Notes (Signed)
 "  Subjective:    Patient ID: Sean Sean, male    DOB: [birth year], 79 y.o.   MRN: [id number]  HPI: Sean Sean is a 79 y.o. male who returns for follow up appointment for chronic pain and medication refill. He states his pain is located in his mid- back and lower back pain radiating into his left lower extremity. He rates his pain 6. His current exercise regime is walking and performing stretching exercises.  Sean Sean arrived tachycardic , he is compliant with his medication, spoke with his daughter Sean. He will keep vital flow sheet and F/U with his PCP she verbalizes understanding.   Sean Sean equivalent is 33.75 MME.   Oral Swab was Performed today.      Pain Inventory Average Pain 5 Pain Right Now 6 My pain is constant and stabbing  In the last 24 hours, has pain interfered with the following? General activity 4 Relation with others 4 Enjoyment of life 4 What TIME of day is your pain at its worst? night Sleep (in general) Poor  Pain is worse with: sitting and some activites Pain improves with: medication and TENS Relief from Meds: 6  No family history on file. Social History   Socioeconomic History   Marital status: Married    Spouse name: Not on file   Number of children: Not on file   Years of education: Not on file   Highest education level: Not on file  Occupational History   Not on file  Tobacco Use   Smoking status: Former    Types: Cigarettes   Smokeless tobacco: Never  Vaping Use   Vaping status: Never Used  Substance and Sexual Activity   Alcohol use: Not Currently   Drug use: Never   Sexual activity: Not on file  Other Topics Concern   Not on file  Social History Narrative   Not on file   Social Drivers of Health   Tobacco Use: Medium Risk (08/21/2024)   Patient History    Smoking Tobacco Use: Former    Smokeless Tobacco Use: Never    Passive Exposure: Not on [place] Strain: Not on file  Food  Insecurity: Not on file  Transportation Needs: Not on file  Physical Activity: Not on file  Stress: Not on file  Social Connections: Not on file  Depression (PHQ2-9): Low Risk (06/26/2024)   Depression (PHQ2-9)    PHQ-2 Score: 0  Alcohol Screen: Not on file  Housing: Not on file  Utilities: Not on file  Health Literacy: Not on file   Past Surgical History:  Procedure Laterality Date   BACK SURGERY     BRAIN SURGERY     CHOLECYSTECTOMY     KNEE ARTHROSCOPY     NECK SURGERY     SPINE SURGERY     Spinal Cord Stimulator   THYROIDECTOMY     Past Surgical History:  Procedure Laterality Date   BACK SURGERY     BRAIN SURGERY     CHOLECYSTECTOMY     KNEE ARTHROSCOPY     NECK SURGERY     SPINE SURGERY     Spinal Cord Stimulator   THYROIDECTOMY     Past Medical History:  Diagnosis Date   Diabetes (HCC)    Hypercholesteremia    Hypertension    Kidney stone    Peptic ulcer disease    There were no vitals taken for this visit.  Opioid Risk Score:   Fall  Risk Score:  `1  Depression screen [facility] 2/9     06/26/2024    1:47 PM 01/27/2024   12:50 PM 12/02/2023    1:00 PM 10/05/2023   12:51 PM 08/06/2023    2:35 PM 02/12/2023    9:06 AM 12/08/2022   10:33 AM  Depression screen PHQ 2/9  Decreased Interest 0 0 0 0 0 0 0  Down, Depressed, Hopeless 0 0 0 0 0 0 0  PHQ - 2 Score 0 0 0 0 0 0 0    Review of Systems  Musculoskeletal:  Positive for back pain.       Left leg pain down to left foot  All other systems reviewed and are negative.      Objective:   Physical Exam Vitals and nursing note reviewed.  Constitutional:      Appearance: Normal appearance.  Cardiovascular:     Rate and Rhythm: Regular rhythm. Tachycardia present.     Pulses: Normal pulses.     Heart sounds: Normal heart sounds.  Pulmonary:     Effort: Pulmonary effort is normal.     Breath sounds: Normal breath sounds.  Musculoskeletal:     Cervical back: Normal range of motion and neck supple.      Comments: Normal Muscle Bulk and Muscle Testing Reveals:  Upper Extremities: Full ROM and Muscle Strength 5/5  Thoracic Paraspinal Tenderness: T-4-T-6  Lower Extremities: Full ROM and Muscle Strength 5/5 Arises from Chair with ease Narrow Based  Gait     Skin:    General: Skin is warm and dry.  Neurological:     Mental Status: He is alert and oriented to person, place, and time.  Psychiatric:        Mood and Affect: Mood normal.        Behavior: Behavior normal.           Assessment & Plan:  1. Lumbar Post Laminectomy Syndrome:Chronic Thoracic Back Pain and  Bilateral Lower  Back Pain:  Continue HEP as Tolerated. Continue to Monitor. 10/24/2024. 2. Left Lumbar Radiculitis:  Continue current medication regimen. Continue to Monitor. 10/24/2024 3. Chronic Bilateral Thoracic Back Pain : Continue HEP as tolerated. Continue to Monitor. 10/24/2024 3. Chronic Pain Syndrome: Refilled:  Hydrocodone  7.5/325 mg one tablet every 6 hours as needed for moderate pain #135 Second script sent for the following month. 10/24/2024. We will continue the opioid monitoring program, this consists of regular clinic visits, examinations, urine drug screen, pill counts as well as use of [place]  Controlled Substance Reporting system. A 12 month History has been reviewed on the [place]  Controlled Substance Reporting System on 10/24/2024  4. Tachycardic: Spoke with Sean Sean daughter, they will keep a vital flow sheet and F/U with his PCP. She verbalizes understanding.  F/U in 2 months        "

## 2024-10-26 ENCOUNTER — Encounter: Admitting: Registered Nurse

## 2024-12-19 ENCOUNTER — Encounter: Admitting: Registered Nurse
